# Patient Record
Sex: Female | Born: 1935 | Race: White | Hispanic: No | Smoking: Never smoker
Health system: Southern US, Community
[De-identification: ages and names within clinical notes are randomized; demographics above are authoritative.]

## PROBLEM LIST (undated history)

## (undated) DIAGNOSIS — F0280 Dementia in other diseases classified elsewhere without behavioral disturbance: Secondary | ICD-10-CM

## (undated) DIAGNOSIS — G309 Alzheimer's disease, unspecified: Secondary | ICD-10-CM

## (undated) DIAGNOSIS — I10 Essential (primary) hypertension: Secondary | ICD-10-CM

## (undated) DIAGNOSIS — F419 Anxiety disorder, unspecified: Secondary | ICD-10-CM

## (undated) DIAGNOSIS — D509 Iron deficiency anemia, unspecified: Secondary | ICD-10-CM

## (undated) HISTORY — PX: OTHER SURGICAL HISTORY: SHX169

---

## 2004-07-01 ENCOUNTER — Other Ambulatory Visit: Payer: Self-pay

## 2004-07-01 ENCOUNTER — Inpatient Hospital Stay: Payer: Self-pay | Admitting: Internal Medicine

## 2004-07-02 ENCOUNTER — Other Ambulatory Visit: Payer: Self-pay

## 2005-10-27 ENCOUNTER — Ambulatory Visit: Payer: Self-pay | Admitting: General Practice

## 2005-10-29 ENCOUNTER — Inpatient Hospital Stay: Payer: Self-pay | Admitting: Internal Medicine

## 2005-10-29 ENCOUNTER — Other Ambulatory Visit: Payer: Self-pay

## 2006-06-28 ENCOUNTER — Ambulatory Visit: Payer: Self-pay | Admitting: Internal Medicine

## 2007-05-29 ENCOUNTER — Ambulatory Visit: Payer: Self-pay | Admitting: Internal Medicine

## 2007-08-26 ENCOUNTER — Ambulatory Visit: Payer: Self-pay | Admitting: Internal Medicine

## 2007-12-30 ENCOUNTER — Emergency Department: Payer: Self-pay | Admitting: Emergency Medicine

## 2007-12-30 ENCOUNTER — Other Ambulatory Visit: Payer: Self-pay

## 2008-08-15 ENCOUNTER — Emergency Department: Payer: Self-pay

## 2008-11-25 ENCOUNTER — Emergency Department: Payer: Self-pay | Admitting: Internal Medicine

## 2009-02-14 ENCOUNTER — Inpatient Hospital Stay: Payer: Self-pay | Admitting: Internal Medicine

## 2009-07-12 ENCOUNTER — Emergency Department: Payer: Self-pay | Admitting: Emergency Medicine

## 2009-07-14 ENCOUNTER — Emergency Department: Payer: Self-pay | Admitting: Emergency Medicine

## 2009-07-16 ENCOUNTER — Emergency Department: Payer: Self-pay | Admitting: Emergency Medicine

## 2009-11-10 ENCOUNTER — Emergency Department: Payer: Self-pay | Admitting: Emergency Medicine

## 2010-04-27 ENCOUNTER — Emergency Department: Payer: Self-pay | Admitting: Emergency Medicine

## 2010-10-11 ENCOUNTER — Inpatient Hospital Stay: Payer: Self-pay | Admitting: Family Medicine

## 2010-10-21 DIAGNOSIS — G309 Alzheimer's disease, unspecified: Secondary | ICD-10-CM

## 2010-10-21 DIAGNOSIS — F0280 Dementia in other diseases classified elsewhere without behavioral disturbance: Secondary | ICD-10-CM

## 2010-10-21 DIAGNOSIS — D649 Anemia, unspecified: Secondary | ICD-10-CM

## 2010-10-21 DIAGNOSIS — R55 Syncope and collapse: Secondary | ICD-10-CM

## 2010-10-21 DIAGNOSIS — F411 Generalized anxiety disorder: Secondary | ICD-10-CM

## 2010-12-23 DIAGNOSIS — G309 Alzheimer's disease, unspecified: Secondary | ICD-10-CM

## 2010-12-23 DIAGNOSIS — R55 Syncope and collapse: Secondary | ICD-10-CM

## 2010-12-23 DIAGNOSIS — D509 Iron deficiency anemia, unspecified: Secondary | ICD-10-CM

## 2010-12-23 DIAGNOSIS — I10 Essential (primary) hypertension: Secondary | ICD-10-CM

## 2010-12-23 DIAGNOSIS — F411 Generalized anxiety disorder: Secondary | ICD-10-CM

## 2010-12-23 DIAGNOSIS — F0280 Dementia in other diseases classified elsewhere without behavioral disturbance: Secondary | ICD-10-CM

## 2010-12-27 DIAGNOSIS — F411 Generalized anxiety disorder: Secondary | ICD-10-CM

## 2011-01-20 DIAGNOSIS — G309 Alzheimer's disease, unspecified: Secondary | ICD-10-CM

## 2011-01-20 DIAGNOSIS — F411 Generalized anxiety disorder: Secondary | ICD-10-CM

## 2011-01-20 DIAGNOSIS — F0280 Dementia in other diseases classified elsewhere without behavioral disturbance: Secondary | ICD-10-CM

## 2011-01-20 DIAGNOSIS — I10 Essential (primary) hypertension: Secondary | ICD-10-CM

## 2011-01-23 ENCOUNTER — Emergency Department: Payer: Self-pay | Admitting: Emergency Medicine

## 2011-01-24 ENCOUNTER — Emergency Department: Payer: Self-pay | Admitting: Emergency Medicine

## 2011-03-07 ENCOUNTER — Ambulatory Visit: Payer: Self-pay | Admitting: Internal Medicine

## 2011-03-15 DIAGNOSIS — F0280 Dementia in other diseases classified elsewhere without behavioral disturbance: Secondary | ICD-10-CM

## 2011-03-15 DIAGNOSIS — R609 Edema, unspecified: Secondary | ICD-10-CM

## 2011-03-15 DIAGNOSIS — F411 Generalized anxiety disorder: Secondary | ICD-10-CM

## 2011-05-12 DIAGNOSIS — K219 Gastro-esophageal reflux disease without esophagitis: Secondary | ICD-10-CM

## 2011-05-12 DIAGNOSIS — G309 Alzheimer's disease, unspecified: Secondary | ICD-10-CM

## 2011-05-12 DIAGNOSIS — F0280 Dementia in other diseases classified elsewhere without behavioral disturbance: Secondary | ICD-10-CM

## 2011-05-12 DIAGNOSIS — F411 Generalized anxiety disorder: Secondary | ICD-10-CM

## 2011-05-12 DIAGNOSIS — I10 Essential (primary) hypertension: Secondary | ICD-10-CM

## 2011-05-16 DIAGNOSIS — M1990 Unspecified osteoarthritis, unspecified site: Secondary | ICD-10-CM

## 2011-06-26 DIAGNOSIS — J309 Allergic rhinitis, unspecified: Secondary | ICD-10-CM

## 2011-07-07 DIAGNOSIS — I10 Essential (primary) hypertension: Secondary | ICD-10-CM

## 2011-07-07 DIAGNOSIS — F0280 Dementia in other diseases classified elsewhere without behavioral disturbance: Secondary | ICD-10-CM

## 2011-07-07 DIAGNOSIS — F411 Generalized anxiety disorder: Secondary | ICD-10-CM

## 2011-07-07 DIAGNOSIS — G309 Alzheimer's disease, unspecified: Secondary | ICD-10-CM

## 2011-09-26 DIAGNOSIS — F411 Generalized anxiety disorder: Secondary | ICD-10-CM

## 2011-09-26 DIAGNOSIS — G309 Alzheimer's disease, unspecified: Secondary | ICD-10-CM

## 2011-09-26 DIAGNOSIS — F0280 Dementia in other diseases classified elsewhere without behavioral disturbance: Secondary | ICD-10-CM

## 2011-09-26 DIAGNOSIS — I10 Essential (primary) hypertension: Secondary | ICD-10-CM

## 2011-09-26 DIAGNOSIS — D649 Anemia, unspecified: Secondary | ICD-10-CM

## 2011-10-13 ENCOUNTER — Telehealth: Payer: Self-pay | Admitting: Family Medicine

## 2011-10-13 NOTE — Telephone Encounter (Signed)
Error

## 2011-11-17 DIAGNOSIS — G309 Alzheimer's disease, unspecified: Secondary | ICD-10-CM

## 2011-11-17 DIAGNOSIS — F0280 Dementia in other diseases classified elsewhere without behavioral disturbance: Secondary | ICD-10-CM

## 2011-11-17 DIAGNOSIS — I10 Essential (primary) hypertension: Secondary | ICD-10-CM

## 2011-11-17 DIAGNOSIS — J45909 Unspecified asthma, uncomplicated: Secondary | ICD-10-CM

## 2011-12-07 DIAGNOSIS — R350 Frequency of micturition: Secondary | ICD-10-CM

## 2012-01-05 DIAGNOSIS — F0280 Dementia in other diseases classified elsewhere without behavioral disturbance: Secondary | ICD-10-CM

## 2012-01-05 DIAGNOSIS — G309 Alzheimer's disease, unspecified: Secondary | ICD-10-CM

## 2012-01-05 DIAGNOSIS — I10 Essential (primary) hypertension: Secondary | ICD-10-CM

## 2012-01-05 DIAGNOSIS — F411 Generalized anxiety disorder: Secondary | ICD-10-CM

## 2012-01-30 DIAGNOSIS — R109 Unspecified abdominal pain: Secondary | ICD-10-CM

## 2012-02-05 ENCOUNTER — Telehealth: Payer: Self-pay | Admitting: Internal Medicine

## 2012-02-05 NOTE — Telephone Encounter (Signed)
Urinalysis completely normal so Rx not indicated

## 2012-02-05 NOTE — Telephone Encounter (Signed)
Triage Record Num: [id number] Operator: Alphonsa Overall Patient Name: Marilea Gwynne Call Date & Time: 02/03/2012 4:38:42PM Patient Phone: PCP: [name] Patient Gender: Female PCP Fax : [phone] Patient DOB: Mar 07, 1933 Practice Name[facility] Reason for Call: Caller: Sharon/RN; PCP: [name]; CB#: [phone]; Call regarding Lab results [facility] calling about Urine cx done for confusion. >100,000 ecoli. Sensitive to Imepinum,Nitrofurantoin, Septra. No symptoms. Afebrile. Vitals WNL. Not on abx. Pt with hx of UTI, clean catch urine. Paged Dr Tawanna Cooler for possible orders. Septra DS 1 po BID x 10 days, Jasmine December aware. Protocol(s) Used: Office Note Recommended Outcome per Protocol: Information Noted and Sent to Office Reason for Outcome: Caller information to office Care Advice: ~ 02/03/2012 5:51:13PM Page 1 of 1 CAN_TriageRpt_V2

## 2012-02-05 NOTE — Telephone Encounter (Signed)
Triage Record Num: [id number] Operator: Maryfrances Bunnell Patient Name: Heather Kirk Call Date & Time: 02/02/2012 6:15:47PM Patient Phone: PCP: Jethro Bolus Name: Nellie Dameron Relationship to Patient: Unknown Patient Gender: Female PCP Fax : Patient DOB: June 17, 1933 Practice Name: [facility] Reason for Call: Rhea Belton RN; Shona Simpson; PCP Talbot Grumbling; Call re: Labs; Preliminary report: Urine culture: gram neg rods > 100,000 col/ml. Final report or Sensitivity not back. No noted symptoms in patient today, afebrile. Advised to monitor symptoms and call MD with final report as soon as received. Protocol(s) Used: Office Note Recommended Outcome per Protocol: Information Noted and Sent to Office Reason for Outcome: Caller information to office Care Advice: ~ 02/02/2012 6:53:18PM Page 1 of 1 CAN_TriageRpt_V2

## 2012-03-25 DIAGNOSIS — G309 Alzheimer's disease, unspecified: Secondary | ICD-10-CM

## 2012-03-25 DIAGNOSIS — F411 Generalized anxiety disorder: Secondary | ICD-10-CM

## 2012-03-25 DIAGNOSIS — F0280 Dementia in other diseases classified elsewhere without behavioral disturbance: Secondary | ICD-10-CM

## 2012-04-16 DIAGNOSIS — R42 Dizziness and giddiness: Secondary | ICD-10-CM

## 2012-04-16 DIAGNOSIS — R209 Unspecified disturbances of skin sensation: Secondary | ICD-10-CM

## 2012-04-18 DIAGNOSIS — I6789 Other cerebrovascular disease: Secondary | ICD-10-CM

## 2012-04-19 ENCOUNTER — Ambulatory Visit: Payer: Self-pay | Admitting: Internal Medicine

## 2012-04-20 ENCOUNTER — Emergency Department: Payer: Self-pay | Admitting: Unknown Physician Specialty

## 2012-04-22 ENCOUNTER — Telehealth: Payer: Self-pay | Admitting: Internal Medicine

## 2012-04-22 ENCOUNTER — Encounter: Payer: Self-pay | Admitting: Internal Medicine

## 2012-04-22 NOTE — Telephone Encounter (Signed)
[facility] Triage Call Report Triage Record Num: [id number] Operator: Jacquenette Shone Patient Name: Heather Kirk Call Date & Time: 04/20/2012 4:16:49PM Patient Phone: PCP: [name] Patient Gender: Female PCP Fax : [phone] Patient DOB: 1933-04-18 Practice Name[facility] Reason for Call: Caller: Cathy/LPN; PCP: [name] (Family Practice); CB#: [phone]; Triage nurse returned call to [facility] and with Jasmine December, RN Lynden Ang, LPN is now gone for the day.) Reports left wrist swollen and painful. Skin red and hot to touch. Patient Onset: 09/21. Treatment:Ultracet given approximnately 2:15pm and patient requesting more pain med. noiw. Jasmine December, RN reports patient refuses to let anyone touch wrist due to pain. States she is "acting confused and she is not normally confused". Patient had fall on 04/17/12. Details of fall are not available. All emergent symptoms ruled out per Wrist Non-Injury with exception " unbearable pain". Advised to have patient evaluated in ED now. Jasmine December, RN verbalized understanding and agreement. Protocol(s) Used: Wrist Non-Injury Recommended Outcome per Protocol: See ED Immediately Reason for Outcome: Unbearable pain Care Advice: ~ IMMEDIATE ACTION Write down provider's name. List or place the following in a bag for transport with the patient: current prescription and/or nonprescription medications; alternative treatments, therapies and medications; and street drugs. ~ ~ Support part in position of comfort to reduce pain and swelling; avoid unnecessary movement. 04/20/2012 4:33:09PM Page 1 of 1 CAN_TriageRpt_V2

## 2012-04-22 NOTE — Telephone Encounter (Signed)
Evaluated and sent back

## 2012-05-01 DIAGNOSIS — F411 Generalized anxiety disorder: Secondary | ICD-10-CM

## 2012-05-01 DIAGNOSIS — E876 Hypokalemia: Secondary | ICD-10-CM

## 2012-05-01 DIAGNOSIS — I10 Essential (primary) hypertension: Secondary | ICD-10-CM

## 2012-05-01 DIAGNOSIS — G309 Alzheimer's disease, unspecified: Secondary | ICD-10-CM

## 2012-05-01 DIAGNOSIS — F0280 Dementia in other diseases classified elsewhere without behavioral disturbance: Secondary | ICD-10-CM

## 2012-05-16 DIAGNOSIS — M25539 Pain in unspecified wrist: Secondary | ICD-10-CM

## 2012-07-12 DIAGNOSIS — I10 Essential (primary) hypertension: Secondary | ICD-10-CM

## 2012-07-12 DIAGNOSIS — F411 Generalized anxiety disorder: Secondary | ICD-10-CM

## 2012-07-12 DIAGNOSIS — F0280 Dementia in other diseases classified elsewhere without behavioral disturbance: Secondary | ICD-10-CM

## 2012-07-12 DIAGNOSIS — G309 Alzheimer's disease, unspecified: Secondary | ICD-10-CM

## 2012-09-20 DIAGNOSIS — I10 Essential (primary) hypertension: Secondary | ICD-10-CM

## 2012-09-20 DIAGNOSIS — F411 Generalized anxiety disorder: Secondary | ICD-10-CM

## 2012-09-20 DIAGNOSIS — T7840XA Allergy, unspecified, initial encounter: Secondary | ICD-10-CM

## 2012-09-20 DIAGNOSIS — J45909 Unspecified asthma, uncomplicated: Secondary | ICD-10-CM

## 2012-09-20 DIAGNOSIS — G309 Alzheimer's disease, unspecified: Secondary | ICD-10-CM

## 2012-09-20 DIAGNOSIS — F0280 Dementia in other diseases classified elsewhere without behavioral disturbance: Secondary | ICD-10-CM

## 2012-10-30 DIAGNOSIS — L988 Other specified disorders of the skin and subcutaneous tissue: Secondary | ICD-10-CM

## 2012-11-18 DIAGNOSIS — F411 Generalized anxiety disorder: Secondary | ICD-10-CM

## 2012-11-18 DIAGNOSIS — F0280 Dementia in other diseases classified elsewhere without behavioral disturbance: Secondary | ICD-10-CM

## 2012-11-18 DIAGNOSIS — G309 Alzheimer's disease, unspecified: Secondary | ICD-10-CM

## 2012-11-18 DIAGNOSIS — I10 Essential (primary) hypertension: Secondary | ICD-10-CM

## 2012-11-18 DIAGNOSIS — M62838 Other muscle spasm: Secondary | ICD-10-CM

## 2013-01-07 DIAGNOSIS — J019 Acute sinusitis, unspecified: Secondary | ICD-10-CM

## 2013-01-17 DIAGNOSIS — M7989 Other specified soft tissue disorders: Secondary | ICD-10-CM

## 2013-03-25 DIAGNOSIS — M159 Polyosteoarthritis, unspecified: Secondary | ICD-10-CM

## 2013-03-25 DIAGNOSIS — F0390 Unspecified dementia without behavioral disturbance: Secondary | ICD-10-CM

## 2013-03-25 DIAGNOSIS — I10 Essential (primary) hypertension: Secondary | ICD-10-CM

## 2013-03-25 DIAGNOSIS — F411 Generalized anxiety disorder: Secondary | ICD-10-CM

## 2013-03-25 DIAGNOSIS — F22 Delusional disorders: Secondary | ICD-10-CM

## 2013-04-17 DIAGNOSIS — M109 Gout, unspecified: Secondary | ICD-10-CM

## 2013-04-22 DIAGNOSIS — J029 Acute pharyngitis, unspecified: Secondary | ICD-10-CM

## 2013-05-16 DIAGNOSIS — D509 Iron deficiency anemia, unspecified: Secondary | ICD-10-CM

## 2013-05-16 DIAGNOSIS — G309 Alzheimer's disease, unspecified: Secondary | ICD-10-CM

## 2013-05-16 DIAGNOSIS — F0280 Dementia in other diseases classified elsewhere without behavioral disturbance: Secondary | ICD-10-CM

## 2013-05-16 DIAGNOSIS — F411 Generalized anxiety disorder: Secondary | ICD-10-CM

## 2013-07-15 DIAGNOSIS — F411 Generalized anxiety disorder: Secondary | ICD-10-CM

## 2013-07-15 DIAGNOSIS — M1710 Unilateral primary osteoarthritis, unspecified knee: Secondary | ICD-10-CM

## 2013-07-15 DIAGNOSIS — F0390 Unspecified dementia without behavioral disturbance: Secondary | ICD-10-CM

## 2013-07-15 DIAGNOSIS — I10 Essential (primary) hypertension: Secondary | ICD-10-CM

## 2013-09-16 DIAGNOSIS — M545 Low back pain, unspecified: Secondary | ICD-10-CM

## 2013-11-26 DIAGNOSIS — I10 Essential (primary) hypertension: Secondary | ICD-10-CM

## 2013-11-26 DIAGNOSIS — IMO0002 Reserved for concepts with insufficient information to code with codable children: Secondary | ICD-10-CM

## 2013-11-26 DIAGNOSIS — M1710 Unilateral primary osteoarthritis, unspecified knee: Secondary | ICD-10-CM

## 2013-11-26 DIAGNOSIS — F0390 Unspecified dementia without behavioral disturbance: Secondary | ICD-10-CM

## 2013-11-26 DIAGNOSIS — F411 Generalized anxiety disorder: Secondary | ICD-10-CM

## 2013-12-19 DIAGNOSIS — J45909 Unspecified asthma, uncomplicated: Secondary | ICD-10-CM

## 2013-12-29 DIAGNOSIS — S40029A Contusion of unspecified upper arm, initial encounter: Secondary | ICD-10-CM

## 2014-02-09 DIAGNOSIS — F23 Brief psychotic disorder: Secondary | ICD-10-CM

## 2014-02-16 DIAGNOSIS — N390 Urinary tract infection, site not specified: Secondary | ICD-10-CM

## 2014-03-07 ENCOUNTER — Inpatient Hospital Stay: Payer: Self-pay | Admitting: Internal Medicine

## 2014-03-07 LAB — URINALYSIS, COMPLETE
BACTERIA: NONE SEEN
Bilirubin,UR: NEGATIVE
Glucose,UR: NEGATIVE mg/dL (ref 0–75)
Ketone: NEGATIVE
LEUKOCYTE ESTERASE: NEGATIVE
Nitrite: NEGATIVE
PROTEIN: NEGATIVE
Ph: 5 (ref 4.5–8.0)
SPECIFIC GRAVITY: 1.013 (ref 1.003–1.030)
Squamous Epithelial: <1

## 2014-03-07 LAB — PROTIME-INR
INR: 1
Prothrombin Time: 12.9 secs (ref 11.5–14.7)

## 2014-03-07 LAB — BASIC METABOLIC PANEL
Anion Gap: 7 (ref 7–16)
BUN: 22 mg/dL — AB (ref 7–18)
CHLORIDE: 109 mmol/L — AB (ref 98–107)
CO2: 24 mmol/L (ref 21–32)
CREATININE: 1.48 mg/dL — AB (ref 0.60–1.30)
Calcium, Total: 9.4 mg/dL (ref 8.5–10.1)
EGFR (Non-African Amer.): 34 — ABNORMAL LOW
GFR CALC AF AMER: 39 — AB
Glucose: 119 mg/dL — ABNORMAL HIGH (ref 65–99)
OSMOLALITY: 284 (ref 275–301)
POTASSIUM: 4.5 mmol/L (ref 3.5–5.1)
Sodium: 140 mmol/L (ref 136–145)

## 2014-03-07 LAB — CBC
HCT: 40.9 % (ref 35.0–47.0)
HGB: 13.7 g/dL (ref 12.0–16.0)
MCH: 28.4 pg (ref 26.0–34.0)
MCHC: 33.5 g/dL (ref 32.0–36.0)
MCV: 85 fL (ref 80–100)
Platelet: 211 10*3/uL (ref 150–440)
RBC: 4.82 10*6/uL (ref 3.80–5.20)
RDW: 13.7 % (ref 11.5–14.5)
WBC: 9.9 10*3/uL (ref 3.6–11.0)

## 2014-03-08 LAB — CBC WITH DIFFERENTIAL/PLATELET
BASOS ABS: 0.1 10*3/uL (ref 0.0–0.1)
Basophil %: 1.2 %
EOS ABS: 0.2 10*3/uL (ref 0.0–0.7)
EOS PCT: 3.5 %
HCT: 38.5 % (ref 35.0–47.0)
HGB: 12.6 g/dL (ref 12.0–16.0)
Lymphocyte #: 1 10*3/uL (ref 1.0–3.6)
Lymphocyte %: 20.2 %
MCH: 28.2 pg (ref 26.0–34.0)
MCHC: 32.7 g/dL (ref 32.0–36.0)
MCV: 86 fL (ref 80–100)
MONO ABS: 0.5 x10 3/mm (ref 0.2–0.9)
MONOS PCT: 10 %
NEUTROS PCT: 65.1 %
Neutrophil #: 3.3 10*3/uL (ref 1.4–6.5)
Platelet: 179 10*3/uL (ref 150–440)
RBC: 4.48 10*6/uL (ref 3.80–5.20)
RDW: 14.1 % (ref 11.5–14.5)
WBC: 5 10*3/uL (ref 3.6–11.0)

## 2014-03-08 LAB — BASIC METABOLIC PANEL
Anion Gap: 7 (ref 7–16)
BUN: 24 mg/dL — AB (ref 7–18)
CALCIUM: 8.2 mg/dL — AB (ref 8.5–10.1)
CHLORIDE: 112 mmol/L — AB (ref 98–107)
CO2: 25 mmol/L (ref 21–32)
Creatinine: 1.76 mg/dL — ABNORMAL HIGH (ref 0.60–1.30)
GFR CALC AF AMER: 32 — AB
GFR CALC NON AF AMER: 27 — AB
Glucose: 90 mg/dL (ref 65–99)
OSMOLALITY: 290 (ref 275–301)
POTASSIUM: 5.4 mmol/L — AB (ref 3.5–5.1)
Sodium: 144 mmol/L (ref 136–145)

## 2014-03-08 LAB — POTASSIUM: Potassium: 4.1 mmol/L (ref 3.5–5.1)

## 2014-03-08 LAB — MAGNESIUM: Magnesium: 1.7 mg/dL — ABNORMAL LOW

## 2014-03-09 LAB — CBC WITH DIFFERENTIAL/PLATELET
BASOS ABS: 0.1 10*3/uL (ref 0.0–0.1)
BASOS PCT: 0.9 %
EOS PCT: 5.4 %
Eosinophil #: 0.4 10*3/uL (ref 0.0–0.7)
HCT: 34.7 % — ABNORMAL LOW (ref 35.0–47.0)
HGB: 11.5 g/dL — ABNORMAL LOW (ref 12.0–16.0)
LYMPHS PCT: 16.6 %
Lymphocyte #: 1.1 10*3/uL (ref 1.0–3.6)
MCH: 28.3 pg (ref 26.0–34.0)
MCHC: 33.1 g/dL (ref 32.0–36.0)
MCV: 86 fL (ref 80–100)
MONO ABS: 0.7 x10 3/mm (ref 0.2–0.9)
MONOS PCT: 10.8 %
NEUTROS PCT: 66.3 %
Neutrophil #: 4.4 10*3/uL (ref 1.4–6.5)
Platelet: 149 10*3/uL — ABNORMAL LOW (ref 150–440)
RBC: 4.05 10*6/uL (ref 3.80–5.20)
RDW: 14.3 % (ref 11.5–14.5)
WBC: 6.6 10*3/uL (ref 3.6–11.0)

## 2014-03-09 LAB — URINE CULTURE

## 2014-03-09 LAB — BASIC METABOLIC PANEL
ANION GAP: 9 (ref 7–16)
BUN: 23 mg/dL — ABNORMAL HIGH (ref 7–18)
CALCIUM: 8 mg/dL — AB (ref 8.5–10.1)
CO2: 23 mmol/L (ref 21–32)
CREATININE: 1.77 mg/dL — AB (ref 0.60–1.30)
Chloride: 108 mmol/L — ABNORMAL HIGH (ref 98–107)
GFR CALC AF AMER: 31 — AB
GFR CALC NON AF AMER: 27 — AB
GLUCOSE: 85 mg/dL (ref 65–99)
Osmolality: 282 (ref 275–301)
POTASSIUM: 4.1 mmol/L (ref 3.5–5.1)
Sodium: 140 mmol/L (ref 136–145)

## 2014-03-10 LAB — HEMOGLOBIN: HGB: 12.1 g/dL (ref 12.0–16.0)

## 2014-03-11 DIAGNOSIS — S7290XA Unspecified fracture of unspecified femur, initial encounter for closed fracture: Secondary | ICD-10-CM

## 2014-03-24 DIAGNOSIS — N390 Urinary tract infection, site not specified: Secondary | ICD-10-CM

## 2014-05-27 DIAGNOSIS — F419 Anxiety disorder, unspecified: Secondary | ICD-10-CM

## 2014-05-27 DIAGNOSIS — R451 Restlessness and agitation: Secondary | ICD-10-CM

## 2014-05-27 DIAGNOSIS — F0390 Unspecified dementia without behavioral disturbance: Secondary | ICD-10-CM

## 2014-05-27 DIAGNOSIS — I10 Essential (primary) hypertension: Secondary | ICD-10-CM

## 2014-05-27 DIAGNOSIS — I301 Infective pericarditis: Secondary | ICD-10-CM

## 2014-05-27 DIAGNOSIS — M1990 Unspecified osteoarthritis, unspecified site: Secondary | ICD-10-CM

## 2014-07-28 DIAGNOSIS — F0390 Unspecified dementia without behavioral disturbance: Secondary | ICD-10-CM

## 2014-07-28 DIAGNOSIS — F333 Major depressive disorder, recurrent, severe with psychotic symptoms: Secondary | ICD-10-CM

## 2014-07-28 DIAGNOSIS — M150 Primary generalized (osteo)arthritis: Secondary | ICD-10-CM

## 2014-07-28 DIAGNOSIS — I10 Essential (primary) hypertension: Secondary | ICD-10-CM

## 2014-07-28 DIAGNOSIS — K219 Gastro-esophageal reflux disease without esophagitis: Secondary | ICD-10-CM

## 2014-08-10 DIAGNOSIS — J209 Acute bronchitis, unspecified: Secondary | ICD-10-CM

## 2014-08-17 DIAGNOSIS — R509 Fever, unspecified: Secondary | ICD-10-CM

## 2014-09-18 DIAGNOSIS — M810 Age-related osteoporosis without current pathological fracture: Secondary | ICD-10-CM | POA: Diagnosis not present

## 2014-09-18 DIAGNOSIS — I10 Essential (primary) hypertension: Secondary | ICD-10-CM

## 2014-09-18 DIAGNOSIS — F0150 Vascular dementia without behavioral disturbance: Secondary | ICD-10-CM

## 2014-09-18 DIAGNOSIS — K219 Gastro-esophageal reflux disease without esophagitis: Secondary | ICD-10-CM

## 2014-09-18 DIAGNOSIS — M1990 Unspecified osteoarthritis, unspecified site: Secondary | ICD-10-CM | POA: Diagnosis not present

## 2014-09-18 DIAGNOSIS — F323 Major depressive disorder, single episode, severe with psychotic features: Secondary | ICD-10-CM

## 2014-09-18 DIAGNOSIS — F29 Unspecified psychosis not due to a substance or known physiological condition: Secondary | ICD-10-CM

## 2014-11-19 DIAGNOSIS — J209 Acute bronchitis, unspecified: Secondary | ICD-10-CM

## 2014-11-21 NOTE — Op Note (Signed)
PATIENT NAME:  Heather Kirk, Heather Kirk MR#:  [id number] DATE OF BIRTH:  09-30-1932  DATE OF PROCEDURE:  03/08/2014  PREOPERATIVE DIAGNOSES: Nondisplaced left femoral neck fracture.    PREOPERATIVE DIAGNOSIS: Nondisplaced left femoral neck fracture.   OPERATION: Percutaneous pinning, left femoral neck fracture.   SURGEON: Valinda HoarHoward E. Makih Stefanko.   ANESTHESIA: Spinal.   COMPLICATIONS: None.   DRAINS: None.   ESTIMATED BLOOD LOSS: Minimal.   BLOOD REPLACED:  None.   DESCRIPTION OF PROCEDURE: The patient was brought to the operating room where she underwent satisfactory spinal anesthesia and was placed in the supine position on the fracture table. She was positioned appropriately and the right leg was flexed and abducted. The left leg was internally rotated and placed in minimal traction. Fluoroscopy showed the femoral neck fracture remained in good position on AP and lateral views.   The hip was prepped and draped in sterile fashion and 4 stab wounds were made. The guide pins were inserted and placed in the head and neck in satisfactory position. Four 7.3-mm cannulated cancellous screws were then inserted to appropriate depth. The guide pins were removed. Fluoroscopy showed good position of the all hardware. The stab wounds were closed with 3-0 nylon suture and a dry sterile dressing was applied. The patient was taken out of traction and transferred to her hospital bed in good condition. She was taken to recovery.    ____________________________ Valinda HoarHoward E. Dayane Hillenburg, MD hem:lt D: 03/08/2014 11:37:15 ET T: 03/08/2014 11:59:20 ET JOB#: [id number]  cc: Valinda HoarHoward E. Dahiana Kulak, MD, <Dictator> Valinda HoarHOWARD E Cleven Jansma MD ELECTRONICALLY SIGNED 03/08/2014 21:18

## 2014-11-21 NOTE — H&P (Signed)
PATIENT NAME:  Heather Kirk, Heather Kirk MR#:  [id number] DATE OF BIRTH:  1933/04/18  DATE OF ADMISSION:  03/07/2014  PRIMARY CARE PHYSICIAN: Dr. Diona Fanti.    REFERRING PHYSICIAN: Dr. Margarita Grizzle.    CHIEF COMPLAINT: Mechanical fall today.   HISTORY OF PRESENT ILLNESS: A 81 year old Caucasian female with a history of hypertension, Alzheimer dementia, asthma, GERD, was sent from nursing home due to a fall today. The patient is alert, awake x 2. She said that she fell by accident today. She denies any syncope, loss of consciousness, or seizure. However, she felt dizzy and weak before this fall. The patient denies any other symptoms. She also complains of dysuria, urine frequency, but denies incontinence.   PAST MEDICAL HISTORY: Hypertension, Alzheimer dementia, asthma, GERD, hyperlipidemia.   SOCIAL HISTORY:  No smoking or drinking or illicit drugs.   FAMILY HISTORY: Hypertension.   PAST SURGICAL HISTORY:  Abdominal surgery. The patient does not know the exact name.   ALLERGIES: None.   HOME MEDICATIONS: Vitamin D 50,000 international units one cap once a day, tramadol 50 mg p.o. b.i.d., p.r.n., Seroquel  25 mg p.o. b.i.d., Q-PAP 325 mg 2 tablets 4 times a day p.r.n., potassium chloride 10 mEq 1 tablet b.i.d., milk of magnesia 30 mL b.i.d. p.r.n. for constipation, ipratropium 21 mcg 1 spray in each nostril twice a day, Flonase 50 mcg nasal one spray once a day, ferrous sulfate 325 mg p.o. daily, donepezil 10 mg p.o. once a day at bedtime, Cetirizine 10 mg p.o. once a day p.r.n., Atrovent nasal 1 spray twice a day, aspirin 81 mg p.o. daily, Norvasc 10 mg p.o. daily, alprazolam 0.25 mg p.o. t.i.d.    REVIEW OF SYSTEMS:  CONSTITUTIONAL: The patient denies any fever or chills. No headache but has dizziness and weakness.  EYES: No double vision or blurred vision.  ENT: No postnasal drip, slurred speech, or dysphagia.  CARDIOVASCULAR: No chest pain, palpitation, orthopnea, nocturnal dyspnea. No leg  edema.  PULMONARY: No cough, sputum, shortness of breath, or hemoptysis.  GASTROINTESTINAL: No abdominal pain, nausea, vomiting, or diarrhea. No melena or bloody stool.  GENITOURINARY: Dysuria and urinary frequency. No hematuria or incontinence.  SKIN: No rash or jaundice.  NEUROLOGY: No syncope, loss of consciousness, or seizure.  HEMATOLOGY: No easy bruising  or bleeding.  ENDOCRINOLOGY: No polyuria, polydipsia, heat or cold intolerance.   PHYSICAL EXAMINATION: VITAL SIGNS: Temperature 98.8, blood pressure 129/85, pulse of 74, oxygen saturation 96% on room air.  GENERAL: The patient is alert, awake, oriented, in x 2. Follows commands.  HEENT: Pupils round, equal, and reactive to light and accommodation. Mild dry oral mucosa. Clear oropharynx.  NECK: Supple. No JVD or carotid bruits. No lymphadenopathy. No thyromegaly.  CARDIOVASCULAR: S1, S2, regular rate and rhythm. No murmurs or gallops.  PULMONARY: Bilateral air entry. No wheezing or rales. No use of accessory muscles to breathe.  ABDOMEN: Soft. No distention or tenderness. No organomegaly. Bowel sounds present.  SKIN: No rash or jaundice.  EXTREMITIES: The patient cannot move the left lower extremity. The right lower extremity power 3/5, 5/5 on bilateral upper extremities, and no focal deficit.   LABORATORY DATA: Left hip x-ray showed transverse subcapital left hip fracture, slight impaction, osteopenia. Chest x-ray, an 11 cm rounded soft tissue structure underlying the right heart, suspect hiatal hernia. Recommended a lateral chest x-ray for further evaluation. CBC in normal range. Glucose 119, BUN 22, creatinine 1.48, sodium 140, potassium 4.5, chloride 109, bicarbonate 24. INR 1.0. Urinalysis negative. EKG showed  normal sinus rhythm at a 73 BPM, possible left atrial enlargement, right axis deviation.   IMPRESSIONS:  1.  Left hip fracture.  2.  Acute renal failure with dehydration.  3.  Hypertension.  4.  Alzheimer dementia.  5.   History of asthma.   PLAN OF TREATMENT: 1.  The patient will be admitted to medical floor with telemonitor. We will give IV fluid support. Follow up BMP. Follow up orthopedic surgeon for surgery. The patient has moderate risk for surgery.  2.  We will hold aspirin and start Lopressor, hold Norvasc.   I discussed the patient's condition and plan of treatment with the patient's daughter. The patient has a yellow paper for DNR.   TIME SPENT: About 52 minutes.    ____________________________ Shaune PollackQing Tamaj Jurgens, MD qc:at D: 03/07/2014 20:12:13 ET T: 03/07/2014 20:27:35 ET JOB#: [id number]  cc: Shaune PollackQing Nadirah Socorro, MD, <Dictator> Shaune PollackQING Toinette Lackie MD ELECTRONICALLY SIGNED 03/09/2014 13:27

## 2014-11-21 NOTE — Discharge Summary (Signed)
PATIENT NAME:  Jarome MatinWILLIAMSON, Heather C MR#:  [id number] DATE OF BIRTH:  July 29, 1933  DATE OF ADMISSION:  03/07/2014 DATE OF DISCHARGE:  03/10/2014  PRESENTING COMPLAINT: Fall with hip pain.   DISCHARGE DIAGNOSES: 1.  Nondisplaced left femoral neck fracture status post percutaneous pinning by Dr. Deeann SaintHoward Miller under spinal anesthesia on 03/08/2014.  2.  History of dementia.  3.  Hypertension.  4.  History of acute renal failure with dehydration, improved.  5.  History of asthma, stable.   CODE STATUS: No code, DNR.   DISCHARGE DIET: Regular.   DISCHARGE INSTRUCTIONS: Follow up with Dr. Deeann SaintHoward Miller in 2 weeks.   MEDICATIONS AT DISCHARGE: 1.  Ergocalciferol 50,000 units p.o. monthly, 24th of each month.  2.  Amlodipine 10 mg daily.  3.  Dulcolax 10 mg rectal at bedtime as needed.  4.  Docusate 240 p.o. at bedtime.  5.  Aspirin 325 mg b.i.d. for DVT prophylaxis, per Dr. Deeann SaintHoward Miller, for 3 week and then changed to aspirin 81 mg daily.  6.  Norco 5/325 one tablet q. 4 p.r.n.  7.  Aricept 10 mg at bedtime.  8.  Ferrous sulfate 325 mg p.o. daily.  9.  Ipratropium bromide 0.06% nasal spray to both nostrils b.i.d.  10.  Flonase 2 sprays to both nostrils daily.  11.  Tylenol 650 q. 4 p.r.n.  12.  Docusate 100 mg b.i.d. p.r.n.  13.  Antivert 12.5 mg t.i.d.  14.  Quetiapine 25 mg b.i.d.  15.  Fosamax 70 mg q. weekly.  16.  Calcium carbonate with vitamin D 1 tablet b.i.d.  17.  Citalopram 20 mg daily.  18.  Colchicine 0.6 mg b.i.d.  19.  Zyrtec 10 mg at bedtime.  20.  Alprazolam 0.25 mg q. 8 hourly.  21.  Protonix 40 mg daily.   DIAGNOSTIC DATA: Labs at discharge: Creatinine is 1.77. Potassium is 4.1. Sodium is 140. Hemoglobin is 12.1. White count is 6.6. Platelet count is 149.   BRIEF SUMMARY OF HOSPITAL COURSE: Heather Kirk is a pleasant 81 year old Caucasian female who came in with a mechanical fall. She was admitted with:  1.  Acute left nondisplaced femoral neck fracture. The  patient has chronic vertigo and has had falls in the past. She is postop day 2.  She underwent percutaneous left hip pinning by Dr. Deeann SaintHoward Miller on August 9. She is progressing well with physical therapy who recommends rehab. She will [facility] back [facility]. She is on p.o. calcium, bisphosphonates and vitamin D.  2.  Acute renal failure with dehydration and hypokalemia, improved. Potassium is 4.1 at discharge. The patient will not be receiving any p.o. oral potassium.  3.  Hypertension. Continue amlodipine. 4.  Alzheimer dementia, on Donepezil.   5.  History of asthma, stable. Continue p.r.n. oxygen, p.r.n. nebs and oral inhalers.  6.  DVT prophylaxis per Dr. Hyacinth MeekerMiller is with p.o. aspirin 325 mg b.i.d.   Hospital stay otherwise remained stable. The patient remained a patient remained a no code, DNR.   TIME SPENT: 40 minutes.  ____________________________ Wylie HailSona A. Allena KatzPatel, MD sap:sb D: 03/10/2014 13:07:06 ET T: 03/10/2014 13:37:45 ET JOB#: [id number]  cc: Berlene Dixson A. Allena KatzPatel, MD, <Dictator> Willow OraSONA A Cathy Ropp MD ELECTRONICALLY SIGNED 03/11/2014 13:53

## 2014-11-21 NOTE — Consult Note (Signed)
Brief Consult Note: Diagnosis: Left femoral neck fracture.   Patient was seen by consultant.   Consult note dictated.   Recommend to proceed with surgery or procedure.   Recommend further assessment or treatment.   Orders entered.   Discussed with Attending MD.   Comments: 81 year old female with dementia resident of [facility] skilled nursing facility fell this AM injuring the left leg.  Her daughter says she has had multiple falls due to vertigo.  Brought to Emergency Room where exam and X-rays show a hairline left femoral neck fracture. Discussed treatment with daughter who wishes to proceed with percutaneous pinning of the hip. Cleared by Dr Imogene Burnhen for surgery. Will do this in AM.  Risks and benefits of surgery were discussed at length including but not limited to infection, non union, nerve or blood vessed damage, non union, need for repeat surgery, blood clots and lung emboli, and death.   Exam: Awake, confused.  circulation/sensation/motor function good left leg. Bruise left knee.  Pain with range of motion left leg.  Skin intact.  No other injuries noted.,   X-rays: as above  Rx: Percutaneous pinning left hip tomorrow,.  Electronic Signatures: Valinda HoarMiller, Charl Wellen E (MD)  (Signed 08-Aug-15 21:15)  Authored: Brief Consult Note   Last Updated: 08-Aug-15 21:15 by Valinda HoarMiller, Rama Sorci E (MD)

## 2015-01-08 DIAGNOSIS — F22 Delusional disorders: Secondary | ICD-10-CM | POA: Diagnosis not present

## 2015-01-08 DIAGNOSIS — F0150 Vascular dementia without behavioral disturbance: Secondary | ICD-10-CM

## 2015-01-08 DIAGNOSIS — M1990 Unspecified osteoarthritis, unspecified site: Secondary | ICD-10-CM

## 2015-01-08 DIAGNOSIS — K219 Gastro-esophageal reflux disease without esophagitis: Secondary | ICD-10-CM

## 2015-01-08 DIAGNOSIS — F323 Major depressive disorder, single episode, severe with psychotic features: Secondary | ICD-10-CM

## 2015-01-08 DIAGNOSIS — I10 Essential (primary) hypertension: Secondary | ICD-10-CM | POA: Diagnosis not present

## 2015-01-08 DIAGNOSIS — M180 Bilateral primary osteoarthritis of first carpometacarpal joints: Secondary | ICD-10-CM

## 2015-03-11 DIAGNOSIS — K219 Gastro-esophageal reflux disease without esophagitis: Secondary | ICD-10-CM

## 2015-03-11 DIAGNOSIS — M810 Age-related osteoporosis without current pathological fracture: Secondary | ICD-10-CM

## 2015-03-11 DIAGNOSIS — D649 Anemia, unspecified: Secondary | ICD-10-CM | POA: Diagnosis not present

## 2015-03-11 DIAGNOSIS — F0151 Vascular dementia with behavioral disturbance: Secondary | ICD-10-CM

## 2015-03-11 DIAGNOSIS — I10 Essential (primary) hypertension: Secondary | ICD-10-CM

## 2015-03-11 DIAGNOSIS — M159 Polyosteoarthritis, unspecified: Secondary | ICD-10-CM

## 2015-03-11 DIAGNOSIS — F333 Major depressive disorder, recurrent, severe with psychotic symptoms: Secondary | ICD-10-CM

## 2015-05-18 DIAGNOSIS — I10 Essential (primary) hypertension: Secondary | ICD-10-CM

## 2015-05-18 DIAGNOSIS — K219 Gastro-esophageal reflux disease without esophagitis: Secondary | ICD-10-CM

## 2015-05-18 DIAGNOSIS — F323 Major depressive disorder, single episode, severe with psychotic features: Secondary | ICD-10-CM

## 2015-05-18 DIAGNOSIS — M1990 Unspecified osteoarthritis, unspecified site: Secondary | ICD-10-CM | POA: Diagnosis not present

## 2015-05-18 DIAGNOSIS — F0150 Vascular dementia without behavioral disturbance: Secondary | ICD-10-CM

## 2015-05-18 DIAGNOSIS — M810 Age-related osteoporosis without current pathological fracture: Secondary | ICD-10-CM

## 2015-05-18 DIAGNOSIS — D649 Anemia, unspecified: Secondary | ICD-10-CM | POA: Diagnosis not present

## 2015-07-27 DIAGNOSIS — M159 Polyosteoarthritis, unspecified: Secondary | ICD-10-CM

## 2015-07-27 DIAGNOSIS — I10 Essential (primary) hypertension: Secondary | ICD-10-CM | POA: Diagnosis not present

## 2015-07-27 DIAGNOSIS — K219 Gastro-esophageal reflux disease without esophagitis: Secondary | ICD-10-CM

## 2015-07-27 DIAGNOSIS — F333 Major depressive disorder, recurrent, severe with psychotic symptoms: Secondary | ICD-10-CM

## 2015-07-27 DIAGNOSIS — F0151 Vascular dementia with behavioral disturbance: Secondary | ICD-10-CM

## 2015-09-22 DIAGNOSIS — K219 Gastro-esophageal reflux disease without esophagitis: Secondary | ICD-10-CM

## 2015-09-22 DIAGNOSIS — M1990 Unspecified osteoarthritis, unspecified site: Secondary | ICD-10-CM

## 2015-09-22 DIAGNOSIS — I10 Essential (primary) hypertension: Secondary | ICD-10-CM

## 2015-09-22 DIAGNOSIS — F0150 Vascular dementia without behavioral disturbance: Secondary | ICD-10-CM

## 2015-09-22 DIAGNOSIS — F332 Major depressive disorder, recurrent severe without psychotic features: Secondary | ICD-10-CM

## 2015-11-11 DIAGNOSIS — F3341 Major depressive disorder, recurrent, in partial remission: Secondary | ICD-10-CM | POA: Diagnosis not present

## 2015-11-11 DIAGNOSIS — I10 Essential (primary) hypertension: Secondary | ICD-10-CM

## 2015-11-11 DIAGNOSIS — F0151 Vascular dementia with behavioral disturbance: Secondary | ICD-10-CM

## 2015-11-11 DIAGNOSIS — M159 Polyosteoarthritis, unspecified: Secondary | ICD-10-CM

## 2016-01-19 DIAGNOSIS — F39 Unspecified mood [affective] disorder: Secondary | ICD-10-CM | POA: Diagnosis not present

## 2016-01-19 DIAGNOSIS — F0150 Vascular dementia without behavioral disturbance: Secondary | ICD-10-CM

## 2016-01-19 DIAGNOSIS — I10 Essential (primary) hypertension: Secondary | ICD-10-CM | POA: Diagnosis not present

## 2016-01-19 DIAGNOSIS — M1990 Unspecified osteoarthritis, unspecified site: Secondary | ICD-10-CM | POA: Diagnosis not present

## 2016-03-14 DIAGNOSIS — F322 Major depressive disorder, single episode, severe without psychotic features: Secondary | ICD-10-CM | POA: Diagnosis not present

## 2016-03-14 DIAGNOSIS — I10 Essential (primary) hypertension: Secondary | ICD-10-CM

## 2016-03-14 DIAGNOSIS — F0151 Vascular dementia with behavioral disturbance: Secondary | ICD-10-CM | POA: Diagnosis not present

## 2016-03-14 DIAGNOSIS — M159 Polyosteoarthritis, unspecified: Secondary | ICD-10-CM | POA: Diagnosis not present

## 2016-04-22 ENCOUNTER — Emergency Department: Payer: Medicare Other

## 2016-04-22 ENCOUNTER — Emergency Department
Admission: EM | Admit: 2016-04-22 | Discharge: 2016-04-22 | Disposition: A | Discharge status: Home / Self Care | Payer: Medicare Other | Source: Home / Self Care | Attending: Emergency Medicine | Admitting: Emergency Medicine

## 2016-04-22 DIAGNOSIS — K6289 Other specified diseases of anus and rectum: Secondary | ICD-10-CM | POA: Insufficient documentation

## 2016-04-22 DIAGNOSIS — N390 Urinary tract infection, site not specified: Secondary | ICD-10-CM | POA: Insufficient documentation

## 2016-04-22 DIAGNOSIS — G309 Alzheimer's disease, unspecified: Secondary | ICD-10-CM | POA: Insufficient documentation

## 2016-04-22 DIAGNOSIS — I10 Essential (primary) hypertension: Secondary | ICD-10-CM | POA: Insufficient documentation

## 2016-04-22 HISTORY — DX: Alzheimer's disease, unspecified: G30.9

## 2016-04-22 HISTORY — DX: Anxiety disorder, unspecified: F41.9

## 2016-04-22 HISTORY — DX: Iron deficiency anemia, unspecified: D50.9

## 2016-04-22 HISTORY — DX: Essential (primary) hypertension: I10

## 2016-04-22 HISTORY — DX: Dementia in other diseases classified elsewhere, unspecified severity, without behavioral disturbance, psychotic disturbance, mood disturbance, and anxiety: F02.80

## 2016-04-22 LAB — BASIC METABOLIC PANEL
Anion gap: 4 — ABNORMAL LOW (ref 5–15)
BUN: 43 mg/dL — AB (ref 6–20)
CO2: 29 mmol/L (ref 22–32)
CREATININE: 1.86 mg/dL — AB (ref 0.44–1.00)
Calcium: 8.9 mg/dL (ref 8.9–10.3)
Chloride: 107 mmol/L (ref 101–111)
GFR calc Af Amer: 28 mL/min — ABNORMAL LOW (ref >60)
GFR, EST NON AFRICAN AMERICAN: 24 mL/min — AB (ref >60)
Glucose, Bld: 118 mg/dL — ABNORMAL HIGH (ref 65–99)
POTASSIUM: 4.6 mmol/L (ref 3.5–5.1)
SODIUM: 140 mmol/L (ref 135–145)

## 2016-04-22 LAB — CBC WITH DIFFERENTIAL/PLATELET
Basophils Absolute: 0.1 10*3/uL (ref 0–0.1)
Basophils Relative: 1 %
EOS ABS: 0 10*3/uL (ref 0–0.7)
EOS PCT: 0 %
HCT: 42.8 % (ref 35.0–47.0)
Hemoglobin: 14.1 g/dL (ref 12.0–16.0)
LYMPHS ABS: 1.6 10*3/uL (ref 1.0–3.6)
Lymphocytes Relative: 12 %
MCH: 29.2 pg (ref 26.0–34.0)
MCHC: 33 g/dL (ref 32.0–36.0)
MCV: 88.7 fL (ref 80.0–100.0)
Monocytes Absolute: 0.7 10*3/uL (ref 0.2–0.9)
Monocytes Relative: 6 %
Neutro Abs: 11.2 10*3/uL — ABNORMAL HIGH (ref 1.4–6.5)
Neutrophils Relative %: 81 %
PLATELETS: 144 10*3/uL — AB (ref 150–440)
RBC: 4.83 MIL/uL (ref 3.80–5.20)
RDW: 14.7 % — ABNORMAL HIGH (ref 11.5–14.5)
WBC: 13.6 10*3/uL — AB (ref 3.6–11.0)

## 2016-04-22 LAB — URINALYSIS COMPLETE WITH MICROSCOPIC (ARMC ONLY)
Bilirubin Urine: NEGATIVE
Glucose, UA: NEGATIVE mg/dL
Ketones, ur: NEGATIVE mg/dL
Nitrite: NEGATIVE
PROTEIN: 30 mg/dL — AB
Specific Gravity, Urine: 1.023 (ref 1.005–1.030)
Squamous Epithelial / LPF: NONE SEEN
pH: 5 (ref 5.0–8.0)

## 2016-04-22 LAB — TROPONIN I
TROPONIN I: 0.03 ng/mL — AB (ref <0.03)
TROPONIN I: 0.03 ng/mL — AB (ref <0.03)

## 2016-04-22 MED ORDER — CIPROFLOXACIN HCL 500 MG PO TABS
500.0000 mg | ORAL_TABLET | Freq: Two times a day (BID) | ORAL | 0 refills | Status: DC
Start: 2016-04-22 — End: 2016-04-26

## 2016-04-22 MED ORDER — CIPROFLOXACIN HCL 500 MG PO TABS
500.0000 mg | ORAL_TABLET | Freq: Once | ORAL | Status: AC
  Administered 2016-04-22: 500 mg via ORAL
  Filled 2016-04-22: qty 1

## 2016-04-22 MED ORDER — METRONIDAZOLE 500 MG PO TABS: 500.0000 mg | ORAL_TABLET | Freq: Three times a day (TID) | ORAL | 0 refills | Status: DC

## 2016-04-22 MED ORDER — SODIUM CHLORIDE 0.9 % IV BOLUS (SEPSIS)
1000.0000 mL | Freq: Once | INTRAVENOUS | Status: AC
  Administered 2016-04-22: 1000 mL via INTRAVENOUS

## 2016-04-22 MED ORDER — DEXTROSE 5 % IV SOLN
1.0000 g | Freq: Once | INTRAVENOUS | Status: AC
  Administered 2016-04-22: 1 g via INTRAVENOUS
  Filled 2016-04-22: qty 10

## 2016-04-22 MED ORDER — METRONIDAZOLE 500 MG PO TABS
500.0000 mg | ORAL_TABLET | Freq: Once | ORAL | Status: AC
  Administered 2016-04-22: 500 mg via ORAL
  Filled 2016-04-22: qty 1

## 2016-04-22 NOTE — ED Notes (Signed)
Contacted Floydene FlockChristy Tucker to notify her of pt's status but left a message due to no answer.

## 2016-04-22 NOTE — ED Notes (Signed)
Pt incontinent of urine and stool. Cleaned and new brief applied.

## 2016-04-22 NOTE — ED Notes (Signed)
Spoke to daughter [phone]). Updated her on pts status and will keep her updated.

## 2016-04-22 NOTE — Discharge Instructions (Signed)
You were diagnosed with a urinary tract infection and proctitis. Take the antibiotics prescribed to you fully. Follow-up with your doctor once you finish this treatment to undergo colonoscopy to evaluate a possible mass in her rectum. I have discussed these findings with the patient's nurse Artist BeachKathy Little and her daughter Floydene FlockChristy Tucker to ensure the patient receives appropriate follow-up to make sure she does not have rectal or colon cancer. Return to the emergency room if she has fever, worsening mental status, abdominal pain, nausea vomiting, or any other symptoms that are concerning to her caretakers.

## 2016-04-22 NOTE — ED Notes (Signed)
Contacted [facility] and spoke with Olegario MessierKathy Little with report given.

## 2016-04-22 NOTE — ED Provider Notes (Signed)
[facility] Emergency Department Provider Note  ____________________________________________  Time seen: Approximately 4:24 PM  I have reviewed the triage vital signs and the nursing notes.   HISTORY  Chief Complaint Weakness  Level 5 caveat:  Portions of the history and physical were unable to be obtained due to dementia   HPI Heather Kirk is a 82 y.o. female with h/o anemia and dementia who presents from [facility] for weakness, lethargy and BP 70/30. Patient has no complaints at this time and tells me she is hungry. She denies HA, CP, SOB, abdominal pain, nausea, vomiting. She is not sure why she is in the hospital. Patient is oriented to self only which according to EMS is her baseline.  Past Medical History:  Diagnosis Date  . Alzheimer's dementia   . Anxiety disorder   . Hypertension   . Iron deficiency anemia     There are no active problems to display for this patient.   Past Surgical History:  Procedure Laterality Date  . pelvis fracture      Prior to Admission medications   Medication Sig Start Date End Date Taking? Authorizing Provider  acetaminophen (TYLENOL) 650 MG CR tablet Take 650 mg by mouth 3 (three) times daily. [id number]   Yes Historical Provider, MD  aspirin EC 81 MG tablet Take 81 mg by mouth daily.   Yes Historical Provider, MD  cetirizine (ZYRTEC) 10 MG tablet Take 10 mg by mouth daily.   Yes Historical Provider, MD  citalopram (CELEXA) 20 MG tablet Take 20 mg by mouth daily.   Yes Historical Provider, MD  donepezil (ARICEPT) 10 MG tablet Take 10 mg by mouth at bedtime.   Yes Historical Provider, MD  ergocalciferol (VITAMIN D2) 50000 units capsule Take 50,000 Units by mouth every 30 (thirty) days. Due 04/23/2016   Yes Historical Provider, MD  QUEtiapine (SEROQUEL) 25 MG tablet Take 25 mg by mouth 2 (two) times daily.   Yes Historical Provider, MD  ciprofloxacin (CIPRO) 500 MG tablet Take 1 tablet (500  mg total) by mouth 2 (two) times daily. 04/22/16 05/02/16  Nita Sickle, MD  metroNIDAZOLE (FLAGYL) 500 MG tablet Take 1 tablet (500 mg total) by mouth 3 (three) times daily. 04/22/16 05/06/16  Nita Sickle, MD    Allergies Review of patient's allergies indicates no known allergies.  No family history on file.  Social History Social History  Substance Use Topics  . Smoking status: Never Smoker  . Smokeless tobacco: Never Used  . Alcohol use No    Review of Systems  Constitutional: Negative for fever. Eyes: Negative for visual changes. ENT: Negative for sore throat. Cardiovascular: Negative for chest pain. Respiratory: Negative for shortness of breath. Gastrointestinal: Negative for abdominal pain, vomiting or diarrhea. Genitourinary: Negative for dysuria. Musculoskeletal: Negative for back pain. Skin: Negative for rash. Neurological: Negative for headaches, weakness or numbness.  ____________________________________________   PHYSICAL EXAM:  VITAL SIGNS: ED Triage Vitals [04/22/16 1545]  Enc Vitals Group     BP 116/68     Pulse Rate 70     Resp 16     Temp 97.6 F (36.4 C)     Temp Source Axillary     SpO2 100 %     Weight 140 lb (63.5 kg)     Height 5\' 4"  (1.626 m)     Head Circumference      Peak Flow      Pain Score      Pain Loc  Pain Edu?      Excl. in GC?     Constitutional: Alert and oriented to self only. Well appearing and in no apparent distress. HEENT:      Head: Normocephalic and atraumatic.         Eyes: Conjunctivae are normal. Sclera is non-icteric. EOMI. PERRL      Mouth/Throat: Mucous membranes are dry.       Neck: Supple with no signs of meningismus. Cardiovascular: Regular rate and rhythm. No murmurs, gallops, or rubs. 2+ symmetrical distal pulses are present in all extremities. No JVD. Respiratory: Normal respiratory effort. Lungs are clear to auscultation bilaterally. No wheezes, crackles, or rhonchi.  Gastrointestinal:  Soft, non tender, and non distended with positive bowel sounds. No rebound or guarding. Genitourinary: No CVA tenderness. Musculoskeletal: Nontender with normal range of motion in all extremities. No edema, cyanosis, or erythema of extremities. Neurologic: Normal speech and language. Face is symmetric. Moving all extremities. No gross focal neurologic deficits are appreciated. Skin: Skin is warm, dry and intact. No rash noted. Psychiatric: Mood and affect are normal. Speech and behavior are normal.  ____________________________________________   LABS (all labs ordered are listed, but only abnormal results are displayed)  Labs Reviewed  CBC WITH DIFFERENTIAL/PLATELET - Abnormal; Notable for the following:       Result Value   WBC 13.6 (*)    RDW 14.7 (*)    Platelets 144 (*)    Neutro Abs 11.2 (*)    All other components within normal limits  BASIC METABOLIC PANEL - Abnormal; Notable for the following:    Glucose, Bld 118 (*)    BUN 43 (*)    Creatinine, Ser 1.86 (*)    GFR calc non Af Amer 24 (*)    GFR calc Af Amer 28 (*)    Anion gap 4 (*)    All other components within normal limits  URINALYSIS COMPLETEWITH MICROSCOPIC ([facility] ONLY) - Abnormal; Notable for the following:    Color, Urine AMBER (*)    APPearance CLOUDY (*)    Hgb urine dipstick 3+ (*)    Protein, ur 30 (*)    Leukocytes, UA 3+ (*)    Bacteria, UA MANY (*)    All other components within normal limits  TROPONIN I - Abnormal; Notable for the following:    Troponin I 0.03 (*)    All other components within normal limits  TROPONIN I - Abnormal; Notable for the following:    Troponin I 0.03 (*)    All other components within normal limits  URINE CULTURE   ____________________________________________  EKG  ED ECG REPORT I, Nita Sicklearolina Colen Eltzroth, the attending physician, personally viewed and interpreted this ECG.  Normal sinus rhythm, rate of 72, first-degree AV block, prolonged QTC of 502, left axis  deviation, no ST elevations or depressions, low voltage. Unchanged from prior. ____________________________________________  RADIOLOGY  CXR: 1. Stable mild bibasilar scarring versus atelectasis. 2. Possible small left pleural effusion. 3. Stable moderate to large hiatal hernia. 4. Aortic atherosclerosis.   CT renal: 1. Wall thickening at the rectum raises concern for proctitis. An underlying mass cannot be entirely excluded. Sigmoidoscopy could be considered for further evaluation, as deemed clinically appropriate. 2. Large hiatal hernia, containing much of the stomach. 3. Mild left basilar atelectasis or scarring noted. 4. Scattered aortic atherosclerosis. 5. Scattered bilateral renal cysts, both hypodense and hyperdense in nature. Mild bilateral renal atrophy noted. 6. Mild chronic loss of height at vertebral body L1. ____________________________________________  PROCEDURES  Procedure(s) performed: None Procedures Critical Care performed:  None ____________________________________________   INITIAL IMPRESSION / ASSESSMENT AND PLAN / ED COURSE  82 y.o. female with h/o anemia and dementia who presents from [facility] for weakness, lethargy and BP 70/30. Patient with no complaints. Looks dehydrated with dry mucous membranes. VS WNL with no hypotension here. Will give IVF, check labs, UA for any signs of dehydration, electrolyte abnormalities, or infection.  Clinical Course  Comment By Time  UA positive for urinary tract infection and also with blood. Since patient is demented a CT renal was done to rule out possible obstructing stone. CT was negative for stone but concerning for proctitis. Will send patient on flagyl and cipro that will cover for both and f/u for colonoscopy to rule out underlying mass. Remaining of work up WNL.  Nita Sickle, MD 09/23 2035   I spoke with patient's nurse at her facility, nurse Artist [place] and also patient's daughter Floydene Flock about the findings of the CT scan for possible rectal mass and recommended that they take patient to follow-up with her PCP in one week for outpatient colonoscopy to better evaluate the possibility of colon/rectal cancer. Daughter will make sure that follow-up is arranged for patient.   Pertinent labs & imaging results that were available during my care of the patient were reviewed by me and considered in my medical decision making (see chart for details).    ____________________________________________   FINAL CLINICAL IMPRESSION(S) / ED DIAGNOSES  Final diagnoses:  UTI (lower urinary tract infection)  Proctitis      NEW MEDICATIONS STARTED DURING THIS VISIT:  New Prescriptions   CIPROFLOXACIN (CIPRO) 500 MG TABLET    Take 1 tablet (500 mg total) by mouth 2 (two) times daily.   METRONIDAZOLE (FLAGYL) 500 MG TABLET    Take 1 tablet (500 mg total) by mouth 3 (three) times daily.     Note:  This document was prepared using Dragon voice recognition software and may include unintentional dictation errors.    Nita Sickle, MD 04/22/16 2056

## 2016-04-22 NOTE — ED Triage Notes (Signed)
[facility] ED [facility] EMS [facility]. Pt sent over to ED for weakness and lethargy. [facility], pt bp 70/30. Upon arrival pts bp 116/68. Pt history of dementia.

## 2016-04-22 NOTE — ED Notes (Signed)
Pt offered food and drink. Pt had small amount of apple sauce and cranberry juice. Denied wanting sandwich or anything else.

## 2016-04-24 DIAGNOSIS — N390 Urinary tract infection, site not specified: Secondary | ICD-10-CM

## 2016-04-24 DIAGNOSIS — K6289 Other specified diseases of anus and rectum: Secondary | ICD-10-CM

## 2016-04-24 LAB — URINE CULTURE

## 2016-04-25 ENCOUNTER — Inpatient Hospital Stay
Admission: EM | Admit: 2016-04-25 | Discharge: 2016-04-26 | DRG: 871 | Disposition: A | Discharge status: Hospice | Payer: Medicare Other | Attending: Internal Medicine | Admitting: Internal Medicine

## 2016-04-25 ENCOUNTER — Emergency Department: Payer: Medicare Other

## 2016-04-25 ENCOUNTER — Encounter: Payer: Self-pay | Admitting: Emergency Medicine

## 2016-04-25 DIAGNOSIS — K219 Gastro-esophageal reflux disease without esophagitis: Secondary | ICD-10-CM | POA: Diagnosis present

## 2016-04-25 DIAGNOSIS — E46 Unspecified protein-calorie malnutrition: Secondary | ICD-10-CM | POA: Diagnosis present

## 2016-04-25 DIAGNOSIS — I9589 Other hypotension: Secondary | ICD-10-CM

## 2016-04-25 DIAGNOSIS — R569 Unspecified convulsions: Secondary | ICD-10-CM | POA: Diagnosis present

## 2016-04-25 DIAGNOSIS — F419 Anxiety disorder, unspecified: Secondary | ICD-10-CM | POA: Diagnosis present

## 2016-04-25 DIAGNOSIS — N390 Urinary tract infection, site not specified: Secondary | ICD-10-CM | POA: Diagnosis present

## 2016-04-25 DIAGNOSIS — G309 Alzheimer's disease, unspecified: Secondary | ICD-10-CM | POA: Diagnosis present

## 2016-04-25 DIAGNOSIS — Z6823 Body mass index (BMI) 23.0-23.9, adult: Secondary | ICD-10-CM | POA: Diagnosis not present

## 2016-04-25 DIAGNOSIS — F0280 Dementia in other diseases classified elsewhere without behavioral disturbance: Secondary | ICD-10-CM | POA: Diagnosis present

## 2016-04-25 DIAGNOSIS — R4182 Altered mental status, unspecified: Secondary | ICD-10-CM | POA: Diagnosis present

## 2016-04-25 DIAGNOSIS — I639 Cerebral infarction, unspecified: Secondary | ICD-10-CM | POA: Diagnosis present

## 2016-04-25 DIAGNOSIS — Z79899 Other long term (current) drug therapy: Secondary | ICD-10-CM | POA: Diagnosis not present

## 2016-04-25 DIAGNOSIS — I959 Hypotension, unspecified: Secondary | ICD-10-CM | POA: Diagnosis present

## 2016-04-25 DIAGNOSIS — I10 Essential (primary) hypertension: Secondary | ICD-10-CM | POA: Diagnosis present

## 2016-04-25 DIAGNOSIS — R410 Disorientation, unspecified: Secondary | ICD-10-CM

## 2016-04-25 DIAGNOSIS — Z66 Do not resuscitate: Secondary | ICD-10-CM | POA: Diagnosis present

## 2016-04-25 DIAGNOSIS — D509 Iron deficiency anemia, unspecified: Secondary | ICD-10-CM | POA: Diagnosis present

## 2016-04-25 DIAGNOSIS — A419 Sepsis, unspecified organism: Secondary | ICD-10-CM | POA: Diagnosis not present

## 2016-04-25 DIAGNOSIS — N179 Acute kidney failure, unspecified: Secondary | ICD-10-CM | POA: Diagnosis present

## 2016-04-25 DIAGNOSIS — Z7982 Long term (current) use of aspirin: Secondary | ICD-10-CM

## 2016-04-25 DIAGNOSIS — E876 Hypokalemia: Secondary | ICD-10-CM | POA: Diagnosis present

## 2016-04-25 DIAGNOSIS — Z515 Encounter for palliative care: Secondary | ICD-10-CM | POA: Diagnosis present

## 2016-04-25 DIAGNOSIS — R627 Adult failure to thrive: Secondary | ICD-10-CM | POA: Diagnosis present

## 2016-04-25 DIAGNOSIS — G9349 Other encephalopathy: Secondary | ICD-10-CM | POA: Diagnosis present

## 2016-04-25 LAB — CBC WITH DIFFERENTIAL/PLATELET
BASOS ABS: 0.1 10*3/uL (ref 0–0.1)
BASOS PCT: 1 %
Eosinophils Absolute: 0 10*3/uL (ref 0–0.7)
Eosinophils Relative: 0 %
HEMATOCRIT: 44.4 % (ref 35.0–47.0)
Hemoglobin: 13.8 g/dL (ref 12.0–16.0)
LYMPHS PCT: 22 %
Lymphs Abs: 4 10*3/uL — ABNORMAL HIGH (ref 1.0–3.6)
MCH: 28.4 pg (ref 26.0–34.0)
MCHC: 31.1 g/dL — ABNORMAL LOW (ref 32.0–36.0)
MCV: 91.1 fL (ref 80.0–100.0)
Monocytes Absolute: 1.7 10*3/uL — ABNORMAL HIGH (ref 0.2–0.9)
Monocytes Relative: 10 %
NEUTROS ABS: 12 10*3/uL — AB (ref 1.4–6.5)
Neutrophils Relative %: 67 %
PLATELETS: 134 10*3/uL — AB (ref 150–440)
RBC: 4.88 MIL/uL (ref 3.80–5.20)
RDW: 15.9 % — ABNORMAL HIGH (ref 11.5–14.5)
WBC: 17.8 10*3/uL — AB (ref 3.6–11.0)

## 2016-04-25 LAB — URINALYSIS COMPLETE WITH MICROSCOPIC (ARMC ONLY)
Bilirubin Urine: NEGATIVE
GLUCOSE, UA: NEGATIVE mg/dL
HGB URINE DIPSTICK: NEGATIVE
KETONES UR: NEGATIVE mg/dL
NITRITE: NEGATIVE
Protein, ur: NEGATIVE mg/dL
SPECIFIC GRAVITY, URINE: 1.016 (ref 1.005–1.030)
pH: 5 (ref 5.0–8.0)

## 2016-04-25 LAB — COMPREHENSIVE METABOLIC PANEL
ALT: 10 U/L — AB (ref 14–54)
AST: 28 U/L (ref 15–41)
Albumin: 1.8 g/dL — ABNORMAL LOW (ref 3.5–5.0)
Alkaline Phosphatase: 68 U/L (ref 38–126)
Anion gap: 11 (ref 5–15)
BILIRUBIN TOTAL: 0.1 mg/dL — AB (ref 0.3–1.2)
BUN: 27 mg/dL — AB (ref 6–20)
CALCIUM: 7.2 mg/dL — AB (ref 8.9–10.3)
CHLORIDE: 117 mmol/L — AB (ref 101–111)
CO2: 17 mmol/L — ABNORMAL LOW (ref 22–32)
CREATININE: 2.15 mg/dL — AB (ref 0.44–1.00)
GFR, EST AFRICAN AMERICAN: 24 mL/min — AB (ref >60)
GFR, EST NON AFRICAN AMERICAN: 20 mL/min — AB (ref >60)
Glucose, Bld: 187 mg/dL — ABNORMAL HIGH (ref 65–99)
Potassium: 2.7 mmol/L — CL (ref 3.5–5.1)
Sodium: 145 mmol/L (ref 135–145)
TOTAL PROTEIN: 4.1 g/dL — AB (ref 6.5–8.1)

## 2016-04-25 LAB — TROPONIN I: TROPONIN I: 0.51 ng/mL — AB (ref <0.03)

## 2016-04-25 LAB — LACTIC ACID, PLASMA
LACTIC ACID, VENOUS: 3 mmol/L — AB (ref 0.5–1.9)
Lactic Acid, Venous: 5.8 mmol/L (ref 0.5–1.9)

## 2016-04-25 LAB — LIPASE, BLOOD: LIPASE: 14 U/L (ref 11–51)

## 2016-04-25 LAB — PROTIME-INR
INR: 1.36
Prothrombin Time: 16.9 seconds — ABNORMAL HIGH (ref 11.4–15.2)

## 2016-04-25 LAB — APTT: APTT: 26 s (ref 24–36)

## 2016-04-25 MED ORDER — ACETAMINOPHEN 650 MG RE SUPP: 650.0000 mg | Freq: Four times a day (QID) | RECTAL | Status: DC | PRN

## 2016-04-25 MED ORDER — ONDANSETRON HCL 4 MG/2ML IJ SOLN: 4.0000 mg | Freq: Four times a day (QID) | INTRAMUSCULAR | Status: DC | PRN

## 2016-04-25 MED ORDER — LORAZEPAM 2 MG/ML PO CONC: 1.0000 mg | ORAL | Status: DC | PRN

## 2016-04-25 MED ORDER — SODIUM CHLORIDE 0.9 % IV BOLUS (SEPSIS)
1000.0000 mL | Freq: Once | INTRAVENOUS | Status: AC
  Administered 2016-04-25: 1000 mL via INTRAVENOUS

## 2016-04-25 MED ORDER — LORAZEPAM 1 MG PO TABS: 1.0000 mg | ORAL_TABLET | ORAL | Status: DC | PRN

## 2016-04-25 MED ORDER — LACTATED RINGERS IV BOLUS (SEPSIS)
2000.0000 mL | Freq: Once | INTRAVENOUS | Status: AC
  Administered 2016-04-25: 2000 mL via INTRAVENOUS
  Filled 2016-04-25: qty 2000

## 2016-04-25 MED ORDER — ONDANSETRON HCL 4 MG PO TABS: 4.0000 mg | ORAL_TABLET | Freq: Four times a day (QID) | ORAL | Status: DC | PRN

## 2016-04-25 MED ORDER — DEXTROSE 5 % IV SOLN
2.0000 g | Freq: Once | INTRAVENOUS | Status: AC
  Administered 2016-04-25: 2 g via INTRAVENOUS
  Filled 2016-04-25: qty 2

## 2016-04-25 MED ORDER — GLYCOPYRROLATE 1 MG PO TABS
1.0000 mg | ORAL_TABLET | ORAL | Status: DC | PRN
  Filled 2016-04-25: qty 1

## 2016-04-25 MED ORDER — ACYCLOVIR SODIUM 50 MG/ML IV SOLN
10.0000 mg/kg | Freq: Once | INTRAVENOUS | Status: AC
  Administered 2016-04-25: 635 mg via INTRAVENOUS
  Filled 2016-04-25: qty 12.7

## 2016-04-25 MED ORDER — GLYCOPYRROLATE 0.2 MG/ML IJ SOLN
0.2000 mg | INTRAMUSCULAR | Status: DC | PRN
  Filled 2016-04-25: qty 1

## 2016-04-25 MED ORDER — HALOPERIDOL LACTATE 5 MG/ML IJ SOLN
INTRAMUSCULAR | Status: AC
  Administered 2016-04-25: 2.5 mg via INTRAVENOUS
  Filled 2016-04-25: qty 1

## 2016-04-25 MED ORDER — ACETAMINOPHEN 325 MG PO TABS: 650.0000 mg | ORAL_TABLET | Freq: Four times a day (QID) | ORAL | Status: DC | PRN

## 2016-04-25 MED ORDER — VANCOMYCIN HCL IN DEXTROSE 1-5 GM/200ML-% IV SOLN
1000.0000 mg | Freq: Once | INTRAVENOUS | Status: AC
  Administered 2016-04-25: 1000 mg via INTRAVENOUS
  Filled 2016-04-25: qty 200

## 2016-04-25 MED ORDER — LORAZEPAM 2 MG/ML IJ SOLN
2.0000 mg | Freq: Once | INTRAMUSCULAR | Status: AC
  Administered 2016-04-25: 2 mg via INTRAVENOUS

## 2016-04-25 MED ORDER — MORPHINE SULFATE (PF) 2 MG/ML IV SOLN
1.0000 mg | INTRAVENOUS | Status: DC | PRN
  Administered 2016-04-25 – 2016-04-26 (×6): 1 mg via INTRAVENOUS
  Filled 2016-04-25 (×7): qty 1

## 2016-04-25 MED ORDER — HALOPERIDOL LACTATE 5 MG/ML IJ SOLN
2.5000 mg | Freq: Once | INTRAMUSCULAR | Status: AC
  Administered 2016-04-25: 2.5 mg via INTRAVENOUS

## 2016-04-25 MED ORDER — LORAZEPAM 2 MG/ML IJ SOLN
INTRAMUSCULAR | Status: AC
  Administered 2016-04-25: 2 mg via INTRAVENOUS
  Filled 2016-04-25: qty 1

## 2016-04-25 MED ORDER — CEFTRIAXONE SODIUM 1 G IJ SOLR
INTRAMUSCULAR | Status: AC
  Administered 2016-04-25: 19:00:00
  Filled 2016-04-25: qty 10

## 2016-04-25 MED ORDER — LORAZEPAM 2 MG/ML IJ SOLN
1.0000 mg | INTRAMUSCULAR | Status: DC | PRN
  Administered 2016-04-26 (×2): 1 mg via INTRAVENOUS
  Filled 2016-04-25 (×3): qty 1

## 2016-04-25 MED ORDER — SODIUM CHLORIDE 0.9 % IV SOLN
1000.0000 mg | INTRAVENOUS | Status: AC
  Administered 2016-04-25: 1000 mg via INTRAVENOUS
  Filled 2016-04-25: qty 10

## 2016-04-25 NOTE — Progress Notes (Signed)
Pt received 5,000 ml boluses in the ED, and pt has not voided.  Bladder scan revealed only 92ml.  Pt is not in distress or seem uncomfortable.  Will continue to monitor patient closely, but no interventions at this point per Dr. Anne HahnWillis.

## 2016-04-25 NOTE — ED Notes (Signed)
Patient transported to CT, verbal okay by MD.

## 2016-04-25 NOTE — H&P (Signed)
[employer] [facility] [place] [facility]   PATIENT NAME: Heather Kirk    MR#:  [id number]  DATE OF BIRTH:  Aug 10, 1932  DATE OF ADMISSION:  04/25/2016  PRIMARY CARE PHYSICIAN: [name], MD   REQUESTING/REFERRING PHYSICIAN: Dr. Alfonse Flavors  CHIEF COMPLAINT:   Chief Complaint  Patient presents with  . Altered Mental Status  . Seizures    HISTORY OF PRESENT ILLNESS:  Heather Kirk  is a 82 y.o. female with a known history of Advanced Alzheimer's dementia, anxiety, hypertension, history of iron deficiency anemia who presented to the hospital due to altered mental status. Patient herself has advanced dementia and therefore most history obtained from the daughter over the phone. As per the daughter patient's health has been slowly declining over the past few weeks. Patient has not been eating and drinking well and has been refusing take his medications at the skilled nursing facility. She presented to the emergency room a few days back was noted to have urinary tract infection discharged on oral ciprofloxacin but has not been taking it at the facility. Today she was more confused and altered and therefore sent to the ER for further evaluation. In the emergency room initially patient was noted to be severely hypotensive. Her urinalysis is also negative for UTI. She was noted to be in acute kidney injury. Patient CT scan of the head did show a acute/subacute occipital CVA. Given her progressive decline in health and her mental status change, the daughter does not want to pursue aggressive care and wants to keep the patient comfortable. Patient is being admitted to the hospital under comfort care measures presently.  PAST MEDICAL HISTORY:   Past Medical History:  Diagnosis Date  . Alzheimer's dementia   . Anxiety disorder   . Hypertension   . Iron deficiency anemia     PAST SURGICAL HISTORY:   Past Surgical History:  Procedure Laterality Date  . pelvis  fracture      SOCIAL HISTORY:   Social History  Substance Use Topics  . Smoking status: Never Smoker  . Smokeless tobacco: Never Used  . Alcohol use No    FAMILY HISTORY:  No family history on file.  Unable to obtain given pt's mental status. No family hx of DM, HTN, CAD, CVA.   DRUG ALLERGIES:  No Known Allergies  REVIEW OF SYSTEMS:   Review of Systems  Unable to perform ROS: Dementia    MEDICATIONS AT HOME:   Prior to Admission medications   Medication Sig Start Date End Date Taking? Authorizing Provider  acetaminophen (TYLENOL) 650 MG CR tablet Take 650 mg by mouth 3 (three) times daily. [id number]    Historical Provider, MD  aspirin EC 81 MG tablet Take 81 mg by mouth daily.    Historical Provider, MD  cetirizine (ZYRTEC) 10 MG tablet Take 10 mg by mouth daily.    Historical Provider, MD  ciprofloxacin (CIPRO) 500 MG tablet Take 1 tablet (500 mg total) by mouth 2 (two) times daily. 04/22/16 05/02/16  Nita Sickle, MD  citalopram (CELEXA) 20 MG tablet Take 20 mg by mouth daily.    Historical Provider, MD  donepezil (ARICEPT) 10 MG tablet Take 10 mg by mouth at bedtime.    Historical Provider, MD  ergocalciferol (VITAMIN D2) 50000 units capsule Take 50,000 Units by mouth every 30 (thirty) days. Due 04/23/2016    Historical Provider, MD  metroNIDAZOLE (FLAGYL) 500 MG tablet Take 1 tablet (500 mg total) by mouth 3 (three) times  daily. 04/22/16 05/06/16  Nita Sicklearolina Veronese, MD  QUEtiapine (SEROQUEL) 25 MG tablet Take 25 mg by mouth 2 (two) times daily.    Historical Provider, MD      VITAL SIGNS:  Blood pressure 90/66, pulse 100, resp. rate 16, weight 63.5 kg (140 lb), SpO2 100 %.  PHYSICAL EXAMINATION:  Physical Exam  GENERAL:  82 y.o.-year-old patient lying in the bed lethargic/encephalpathic.  EYES: Pupils equal, round, reactive to light.. No scleral icterus. Extraocular muscles intact.  HEENT: Head atraumatic, normocephalic. Oropharynx and nasopharynx clear. No  oropharyngeal erythema, dry oral mucosa  NECK:  Supple, no jugular venous distention. No thyroid enlargement, no tenderness.  LUNGS: Normal breath sounds bilaterally, no wheezing, rales, rhonchi. No use of accessory muscles of respiration.  CARDIOVASCULAR: S1, S2 RRR. No murmurs, rubs, gallops, clicks.  ABDOMEN: Soft, nontender, nondistended. Bowel sounds present. No organomegaly or mass.  EXTREMITIES: No pedal edema, cyanosis, or clubbing. + 2 pedal & radial pulses b/l.   NEUROLOGIC: Difficult to a good Neuro exam as pt. Is Encephlopathic PSYCHIATRIC: The patient is alert and oriented x 1. SKIN: No obvious rash, lesion, or ulcer.   LABORATORY PANEL:   CBC  Recent Labs Lab 04/25/16 1407  WBC 17.8*  HGB 13.8  HCT 44.4  PLT 134*   ------------------------------------------------------------------------------------------------------------------  Chemistries   Recent Labs Lab 04/25/16 1437  NA 145  K 2.7*  CL 117*  CO2 17*  GLUCOSE 187*  BUN 27*  CREATININE 2.15*  CALCIUM 7.2*  AST 28  ALT 10*  ALKPHOS 68  BILITOT 0.1*   ------------------------------------------------------------------------------------------------------------------  Cardiac Enzymes  Recent Labs Lab 04/25/16 1437  TROPONINI 0.51*   ------------------------------------------------------------------------------------------------------------------  RADIOLOGY:  Ct Head Wo Contrast  Result Date: 04/25/2016 CLINICAL DATA:  Seizure with altered mental status EXAM: CT HEAD WITHOUT CONTRAST TECHNIQUE: Contiguous axial images were obtained from the base of the skull through the vertex without intravenous contrast. COMPARISON:  April 19, 2012 FINDINGS: Brain: Moderate diffuse atrophy is again noted. There is no intracranial mass, hemorrhage, extra-axial fluid collection, or midline shift. There is evidence of a prior infarct in the medial right occipital lobe. There is mild ex vacuo phenomenon involving  the atrium of the right lateral ventricle in this area. There is extensive small vessel disease throughout the centra semiovale bilaterally. There is evidence of an infarct in the upper mid right occipital lobe which appears recent and may well be acute. No other acute appearing infarct evident. Vascular: There is no hyperdense vessel evident. There is no appreciable vascular calcification. Skull: The bony calvarium appears intact. Sinuses/Orbits: There is mild mucosal thickening in several ethmoid air cells bilaterally. Other paranasal sinuses are clear. There is rightward deviation of the mid nasal septum. Orbits appear symmetric bilaterally. Other: Visualized mastoid air cells are clear. IMPRESSION: Suspect recent/acute infarct in the upper mid right occipital lobe. There is a prior infarct in the medial right occipital lobe with nearby right lateral ventricular encephalomalacia. There is elsewhere generalized atrophy with extensive periventricular small vessel disease. There is no mass, hemorrhage, or extra-axial fluid collection. There is mild mucosal thickening in several ethmoid air cells bilaterally. Electronically Signed   By: Bretta BangWilliam  Woodruff III M.D.   On: 04/25/2016 16:16   Dg Chest Port 1 View  Result Date: 04/25/2016 CLINICAL DATA:  Altered mental status EXAM: PORTABLE CHEST 1 VIEW COMPARISON:  04/22/2016 chest radiograph. FINDINGS: Stable cardiomediastinal silhouette with mild cardiomegaly, aortic atherosclerosis and large hiatal hernia. No pneumothorax. No pleural effusion. No pulmonary edema.  Stable mild compressive atelectasis in the medial lower lobes. Stable blunting of the left costophrenic angle, consistent with pleural-parenchymal scarring as seen on recent CT study. No acute consolidative airspace disease. IMPRESSION: Stable chest radiograph with mild cardiomegaly without pulmonary edema, large hiatal hernia with mild bibasilar atelectasis and aortic atherosclerosis. Electronically  Signed   By: Delbert Phenix M.D.   On: 04/25/2016 14:57     IMPRESSION AND PLAN:   82 year old female with past medical history of advanced dementia, hypertension, Anxiety, iron deficiency anemia presents to the hospital due to altered mental status.  1. Altered mental status 2. Acute/subacute CVA 3. Advanced dementia 4. Acute Kidney Injury 5. Adult Failure to Thrive 6. Hypokalemia 7. Hypotension/sepsis 8. Recent UTI.   Patient's health has been overall declining over the past few weeks as per the daughter. She does not want to pursue aggressive care and her goal is to keep her mother comfortable. I had an extensive discussion in differentiating DO NOT RESUSCITATE and comfort care with over the phone. Patient is now being admitted under comfort care measures. I have placed a palliative care consult for the morning, we'll also consult care management to help assist with this.  -As needed Ativan, morphine ordered for comfort. Glycopyrrolate also ordered for secretions.  All the records are reviewed and case discussed with ED provider. Management plans discussed with the patient, family and they are in agreement.  CODE STATUS: DO NOT RESUSCITATE  TOTAL TIME TAKING CARE OF THIS PATIENT: 45 minutes.    Houston Siren M.D on 04/25/2016 at 5:32 PM  Between 7am to 6pm - Pager - [phone]  After 6pm go to [email or web address] - password [email or web address] [facility]  [facility]  Office  [phone]  CC: Primary care physician; [name], MD

## 2016-04-25 NOTE — ED Provider Notes (Signed)
[facility] Emergency Department Provider Note  ____________________________________________  Time seen: Approximately 2:08 PM  I have reviewed the triage vital signs and the nursing notes.   HISTORY  Chief Complaint Altered Mental Status and Seizures  Level 5 caveat:  Portions of the history and physical were unable to be obtained due to the patient's acute illness and dementia  HPI Heather Kirk is a 82 y.o. female sent to the ED for altered mental status and possible seizure. She was recently diagnosed with a urinary tract infection but has not been receiving antibiotics because she has been refusing them     Past Medical History:  Diagnosis Date  . Alzheimer's dementia   . Anxiety disorder   . Hypertension   . Iron deficiency anemia      There are no active problems to display for this patient.    Past Surgical History:  Procedure Laterality Date  . pelvis fracture       Prior to Admission medications   Medication Sig Start Date End Date Taking? Authorizing Provider  acetaminophen (TYLENOL) 650 MG CR tablet Take 650 mg by mouth 3 (three) times daily. [id number]    Historical Provider, MD  aspirin EC 81 MG tablet Take 81 mg by mouth daily.    Historical Provider, MD  cetirizine (ZYRTEC) 10 MG tablet Take 10 mg by mouth daily.    Historical Provider, MD  ciprofloxacin (CIPRO) 500 MG tablet Take 1 tablet (500 mg total) by mouth 2 (two) times daily. 04/22/16 05/02/16  Nita Sicklearolina Veronese, MD  citalopram (CELEXA) 20 MG tablet Take 20 mg by mouth daily.    Historical Provider, MD  donepezil (ARICEPT) 10 MG tablet Take 10 mg by mouth at bedtime.    Historical Provider, MD  ergocalciferol (VITAMIN D2) 50000 units capsule Take 50,000 Units by mouth every 30 (thirty) days. Due 04/23/2016    Historical Provider, MD  metroNIDAZOLE (FLAGYL) 500 MG tablet Take 1 tablet (500 mg total) by mouth 3 (three) times daily. 04/22/16 05/06/16  Nita Sicklearolina  Veronese, MD  QUEtiapine (SEROQUEL) 25 MG tablet Take 25 mg by mouth 2 (two) times daily.    Historical Provider, MD     Allergies Review of patient's allergies indicates no known allergies.   No family history on file.  Social History Social History  Substance Use Topics  . Smoking status: Never Smoker  . Smokeless tobacco: Never Used  . Alcohol use No    Review of Systems Unable to obtain due to altered mental status..  ____________________________________________   PHYSICAL EXAM:  VITAL SIGNS: ED Triage Vitals  Enc Vitals Group     BP --      Pulse --      Resp 04/25/16 1356 (!) 23     Temp --      Temp src --      SpO2 04/25/16 1356 99 %     Weight 04/25/16 1403 140 lb (63.5 kg)     Height --      Head Circumference --      Peak Flow --      Pain Score --      Pain Loc --      Pain Edu? --      Excl. in GC? --     Vital signs reviewed, nursing assessments reviewed.   Constitutional:   Awake, disoriented and confused. Not interactive.. Yelling nonstop. Eyes:   No scleral icterus. No conjunctival pallor. PERRL ENT   Head:  Normocephalic and atraumatic.   Nose:   No congestion/rhinnorhea. No septal hematoma   Mouth/Throat:   Dry mucous membranes, no pharyngeal erythema. No peritonsillar mass.    Neck:   No stridor. No SubQ emphysema. No meningismus. Hematological/Lymphatic/Immunilogical:   No cervical lymphadenopathy. Cardiovascular:   Irregularly irregular rhythm, heart rate 1:15.. Symmetric bilateral radial and DP pulses.  No murmurs.  Respiratory:   Normal respiratory effort without tachypnea nor retractions. Breath sounds are clear and equal bilaterally. No wheezes/rales/rhonchi. Gastrointestinal:   Soft and nontender. Non distended. There is no CVA tenderness.  No rebound, rigidity, or guarding. Genitourinary:   deferred Musculoskeletal:   Nontender with normal range of motion in all extremities. No joint effusions.  No lower extremity  tenderness.  No edema. Neurologic:   Rapid Incoherent speech consistent with Alzheimer's dementia CN 2-10 normal. Motor grossly intact. No gross focal neurologic deficits are appreciated.  Skin:    Skin is warm, dry and intact. No rash noted.  No petechiae, purpura, or bullae. Poor skin turgor  ____________________________________________    LABS (pertinent positives/negatives) (all labs ordered are listed, but only abnormal results are displayed) Labs Reviewed  CBC WITH DIFFERENTIAL/PLATELET - Abnormal; Notable for the following:       Result Value   WBC 17.8 (*)    MCHC 31.1 (*)    RDW 15.9 (*)    Platelets 134 (*)    Neutro Abs 12.0 (*)    Lymphs Abs 4.0 (*)    Monocytes Absolute 1.7 (*)    All other components within normal limits  URINALYSIS COMPLETEWITH MICROSCOPIC ([facility] ONLY) - Abnormal; Notable for the following:    Color, Urine YELLOW (*)    APPearance CLEAR (*)    Leukocytes, UA TRACE (*)    Bacteria, UA RARE (*)    Squamous Epithelial / LPF 0-5 (*)    All other components within normal limits  TROPONIN I - Abnormal; Notable for the following:    Troponin I 0.51 (*)    All other components within normal limits  COMPREHENSIVE METABOLIC PANEL - Abnormal; Notable for the following:    Potassium 2.7 (*)    Chloride 117 (*)    CO2 17 (*)    Glucose, Bld 187 (*)    BUN 27 (*)    Creatinine, Ser 2.15 (*)    Calcium 7.2 (*)    Total Protein 4.1 (*)    Albumin 1.8 (*)    ALT 10 (*)    Total Bilirubin 0.1 (*)    GFR calc non Af Amer 20 (*)    GFR calc Af Amer 24 (*)    All other components within normal limits  PROTIME-INR - Abnormal; Notable for the following:    Prothrombin Time 16.9 (*)    All other components within normal limits  CULTURE, BLOOD (ROUTINE X 2)  CULTURE, BLOOD (ROUTINE X 2)  CULTURE, EXPECTORATED SPUTUM-ASSESSMENT  URINE CULTURE  LIPASE, BLOOD  APTT  LACTIC ACID, PLASMA  LACTIC ACID, PLASMA    ____________________________________________   EKG    ____________________________________________    RADIOLOGY  Chest x-ray unremarkable CT head pending  ____________________________________________   PROCEDURES Procedures CRITICAL CARE Performed by: Scotty Court, Tiberius Loftus   Total critical care time: 45 minutes  Critical care time was exclusive of separately billable procedures and treating other patients.  Critical care was necessary to treat or prevent imminent or life-threatening deterioration.  Critical care was time spent personally by me on the following activities: development of treatment  plan with patient and/or surrogate as well as nursing, discussions with consultants, evaluation of patient's response to treatment, examination of patient, obtaining history from patient or surrogate, ordering and performing treatments and interventions, ordering and review of laboratory studies, ordering and review of radiographic studies, pulse oximetry and re-evaluation of patient's condition.  ____________________________________________   INITIAL IMPRESSION / ASSESSMENT AND PLAN / ED COURSE  Pertinent labs & imaging results that were available during my care of the patient were reviewed by me and considered in my medical decision making (see chart for details).  Patient presents with altered mental status in the setting of inadequately treated urinary tract infection. She is unable to provide any history or participate in exam. Because of her agitation, she was given IV Haldol to help facilitate her evaluation. Subsequently, she did have a witnessed seizure while I was at the bedside. This lasted 20-30 seconds, gaze fixed to the left with generalized quivering motion. This resolved spontaneously. She was then given IV Ativan. We'll give fluids and IV Keppra while getting labs chest x-ray and CT scan of the head. CODE STATUS is DO NOT RESUSCITATE, so I am restricted from  intubating the patient for airway protection and to facilitate workup for a possible intracranial hemorrhage. Given her advanced dementia and age, it is unlikely that she would be considered a surgical candidate even if she did have a significant intracranial hemorrhage.     Clinical Course  Comment By Time  Pt now calm after haldol and ativan. Bp 65/40. Hr 150. Aggressive IVF 53ml/kg.  Sharman Cheek, MD 09/26 1419  Discussed with family including CODE STATUS and invasive procedures and further management. They agree that we are currently at maximal therapy with IV fluids and antibiotics. If she decompensates further, she is likely to expire from this current illness. Currently heart rate is 110, blood pressure is about 80/50. Sharman Cheek, MD 09/26 1440  Urinalysis unremarkable, chest x-ray unremarkable. Leukocytosis of 18,000. We'll broaden antimicrobial so cefepime and vancomycin and acyclovir. Sharman Cheek, MD 09/26 1506  Bp 100/70, hr 110. Cont. Ivf. D/w ICU for admission planning. Sharman Cheek, MD 09/26 1535    ----------------------------------------- 3:45 PM on 04/25/2016 -----------------------------------------  Discussed with hospitalist for admission planning. Lactate and CT head still pending. ____________________________________________   FINAL CLINICAL IMPRESSION(S) / ED DIAGNOSES  Final diagnoses:  Sepsis, due to unspecified organism [facility])  Delirium  Seizure (HCC)  Other specified hypotension       Portions of this note were generated with dragon dictation software. Dictation errors may occur despite best attempts at proofreading.    Sharman Cheek, MD 04/25/16 [phone]

## 2016-04-25 NOTE — ED Triage Notes (Addendum)
Pt to ED via EMS [facility] called out for unresponsive.  Per EMS patient had possible seizure like activity with short period of unresponsiveness and then not at baseline per facility.  EMS states patient dx UTI last week and prescribed antibiotics but not taking due to non-compliance.  Pt presents responsive, screaming out.  Hx alzheimer's.  MD present at bedside.

## 2016-04-25 NOTE — ED Notes (Signed)
Code  Sepsis  Called  To  Doug  At  [facility] 

## 2016-04-25 NOTE — Consult Note (Addendum)
Patient Seen and examined.  82 yo white female with acute seizures from NH with LOW BP, patient was given 4L IVF's and BP improved Patient obtunded, daughter at bedside   PHYSICAL EXAMINATION:  GENERAL:critically ill appearing,malnourished looking HEAD: Normocephalic, atraumatic.  EYES: Pupils equal, round, reactive to light.  No scleral icterus.  MOUTH: dry mucosal membrane. NECK: Supple. No thyromegaly. No nodules. No JVD. c collar in place PULMONARY:  +rhonchi CARDIOVASCULAR: S1 and S2. Regular rate and rhythm. No murmurs, rubs, or gallops.  GASTROINTESTINAL: Soft, nontender, +distended. No masses. Positive bowel sounds. No hepatosplenomegaly.  MUSCULOSKELETAL: No swelling, clubbing, or edema.  NEUROLOGIC: GCS<8T SKIN:intact,warm,dry   Patient breathing comfortably I have discussed with daughter regarding poor prognosis and cognitive and physical decline of patient Daughter understands that this could be process of dying  I have recommended to admit for floor by Hospitalist, do not place CVL and do not use vasopressors, daughter is in agreement  Patient is DNR/DNI  Recommend Palliative care consult and hospice referral, likely to be made comfort care in 24 hrs Case discussed with Dr. Hubbard RobinsonSainani    Heather Kirk Heather Kirk, M.D.  [facility]  Medical Director [facility] Medical Director [facility] Cardio[facility] Department

## 2016-04-26 ENCOUNTER — Telehealth: Payer: Self-pay | Admitting: Internal Medicine

## 2016-04-26 LAB — URINE CULTURE: Culture: NO GROWTH

## 2016-04-26 MED ORDER — LORAZEPAM 1 MG PO TABS: 1.0000 mg | ORAL_TABLET | ORAL | 0 refills | Status: AC | PRN

## 2016-04-26 MED ORDER — MORPHINE SULFATE 20 MG/5ML PO SOLN: 5.0000 mg | ORAL | 0 refills | Status: AC | PRN

## 2016-04-26 NOTE — Consult Note (Signed)
Consultation Note Date: 04/26/2016   Patient Name: Heather Kirk  DOB: 02/22/1933  MRN: [id number]  Age / Sex: 82 y.o., female  PCP: Karie Schwalbe, MD Referring Physician: Auburn Bilberry, MD  Reason for Consultation: Establishing goals of care, Hospice Evaluation, Non pain symptom management, Pain control and Psychosocial/spiritual support  HPI/Patient Profile: 82 y.o. female with past medical history of dementia, HTN, asthma and GERD, admitted on 04/25/2016 with AMS and witnessed seizures, presumed sepsis with leukocytosis and hypotension. Patient found to have an occipital CVA on CT. Had seized once at her SNF and once in the ED, per daughter. Has not been taking good PO for last couple of weeks. She was seen in ED 9/23 and started on cipro for possible UTI and had not been taking her medication. However, urine culture resulted only with multiple species present. Repeat UA not suggestive of UTI. Patient's kidney function is worsening (SCr 2.15 and K 2.7) and nutritional status is poor (albumin 1.8). She is thought to be end-stage dementia, and patient's daughter has expressed desire for her mother to be comfortable to primary team. Primary team has initiated comfort care measures with ativan, haldol, and morphine ordered. Keppra is being administered for reported seizures. Palliative was consulted for GOC discussion.  Clinical Assessment and Goals of Care: Patient resting comfortably in bed. Spoke with patient's daughter Heather Kirk over the phone under supervision of NP Lorinda Creed. She stated understanding that her mother is having organ failure, that there do not appear to be any reversible disease processes, and that she is not expected to improve. Daughter says she knows her mother's wishes and that she would want to be made comfortable. Discussed role for hospice services. Daughter expressed  preference for residential hospice rather than receiving hospice services at [facility], where she has been for the past 6 years, as she would like her mother to have as close to 1-to-1 care as possible.    HCPOA: Heather Kirk, patient's only living child    SUMMARY OF RECOMMENDATIONS   Pt's daughter interested in pursuing hospice options. Pt is DNR and Comfort Care Only.   Code Status/Advance Care Planning:  DNR   Placed Referral to Social Work for residential hospice placement   Symptom Management:   Patient on IV keppra, otherwise would transition morphine and ativan to PO.  Palliative Prophylaxis:   Delirium Protocol, Frequent Pain Assessment, Oral Care and Turn Reposition  Additional Recommendations (Limitations, Scope, Preferences):  Avoid Hospitalization, Full Comfort Care, Initiate Comfort Feeding and No IV Fluids  Psycho-social/Spiritual:   Additional Recommendations: Caregiving  Support/Resources  Prognosis:   < 2 weeks; end-stage dementia, no longer taking PO, with significant lab abnormalities (albumin 1.8, SCr 2.15, K 2.7, LA 3.0) that are only expected to worsen; with recent stroke and new onset seizures  Discharge Planning: Hospice facility      Primary Diagnoses: Present on Admission: . Altered mental status  I have reviewed the medical record, interviewed the patient and family, and examined the  patient. The following aspects are pertinent.  Past Medical History:  Diagnosis Date  . Alzheimer's dementia   . Anxiety disorder   . Hypertension   . Iron deficiency anemia    Social History   Social History  . Marital status: Widowed    Spouse name: N/A  . Number of children: N/A  . Years of education: N/A   Social History Main Topics  . Smoking status: Never Smoker  . Smokeless tobacco: Never Used  . Alcohol use No  . Drug use: No  . Sexual activity: No   Other Topics Concern  . None   Social History Narrative  . None     Family History  Problem Relation Age of Onset  . Diabetes Neg Hx   . CVA Neg Hx   . Hypertension Neg Hx    Scheduled Meds:  Continuous Infusions:  PRN Meds:.acetaminophen **OR** acetaminophen, glycopyrrolate **OR** glycopyrrolate **OR** glycopyrrolate, LORazepam **OR** LORazepam **OR** LORazepam, morphine injection, ondansetron **OR** ondansetron (ZOFRAN) IV Medications Prior to Admission:  Prior to Admission medications   Medication Sig Start Date End Date Taking? Authorizing Provider  acetaminophen (TYLENOL) 650 MG CR tablet Take 650 mg by mouth See admin instructions. Take 650 mg by mouth 3 times a day at 8 am, 2 pm, 8 pm.-Scheduled Take 650 mg by mouth every 4 hours as needed for pain- PRN   Yes Historical Provider, MD  aspirin 81 MG chewable tablet Chew 81 mg by mouth daily.   Yes Historical Provider, MD  cetirizine (ZYRTEC) 10 MG tablet Take 10 mg by mouth at bedtime.    Yes Historical Provider, MD  ciprofloxacin (CIPRO) 500 MG tablet Take 500 mg by mouth 2 (two) times daily. 04/24/16 04/29/16 Yes Historical Provider, MD  citalopram (CELEXA) 20 MG tablet Take 20 mg by mouth at bedtime.    Yes Historical Provider, MD  donepezil (ARICEPT) 10 MG tablet Take 10 mg by mouth at bedtime.   Yes Historical Provider, MD  ergocalciferol (VITAMIN D2) 50000 units capsule Take 50,000 Units by mouth every 30 (thirty) days. *Take on the 24th*   Yes Historical Provider, MD  metroNIDAZOLE (FLAGYL) 500 MG tablet Take 1 tablet (500 mg total) by mouth 3 (three) times daily. 04/22/16 05/06/16 Yes Nita Sicklearolina Veronese, MD  QUEtiapine (SEROQUEL) 25 MG tablet Take 25 mg by mouth 2 (two) times daily.   Yes Historical Provider, MD  traMADol (ULTRAM) 50 MG tablet Take 50 mg by mouth every 8 (eight) hours as needed.   Yes Historical Provider, MD  ciprofloxacin (CIPRO) 500 MG tablet Take 1 tablet (500 mg total) by mouth 2 (two) times daily. 04/22/16 05/02/16  Nita Sicklearolina Veronese, MD   No Known Allergies Review of  Systems Negative for cough, blood in stool, v/d  Physical Exam General: Thin woman, resting in bed Respiratory: No increased WOB  Vital Signs: BP 122/70 (BP Location: Right Arm)   Pulse 93   Temp 98 F (36.7 C) (Oral)   Resp 15   Ht 5\' 4"  (1.626 m)   Wt 62.4 kg (137 lb 9.6 oz)   SpO2 97%   BMI 23.62 kg/m  Pain Assessment: 0-10     SpO2: SpO2: 97 % O2 Device:SpO2: 97 % O2 Flow Rate: .O2 Flow Rate (L/min): 2 L/min  IO: Intake/output summary:   Intake/Output Summary (Last 24 hours) at 04/26/16 1449 Last data filed at 04/25/16 1504  Gross per 24 hour  Intake  50 ml  Output                0 ml  Net               50 ml    LBM: Last BM Date:  (unknown) Baseline Weight: Weight: 63.5 kg (140 lb) Most recent weight: Weight: 62.4 kg (137 lb 9.6 oz)     Palliative Assessment/Data:   Flowsheet Rows   Flowsheet Row Most Recent Value  Intake Tab  Referral Department  Hospitalist  Unit at Time of Referral  Med/Surg Unit  Palliative Care Primary Diagnosis  Other (Comment)  Date Notified  04/25/16  Palliative Care Type  New Palliative care  Reason for referral  Clarify Goals of Care, Counsel Regarding Hospice, Pain, Non-pain Symptom  Date of Admission  04/25/16  Date first seen by Palliative Care  04/26/16  # of days Palliative referral response time  1 Day(s)  # of days IP prior to Palliative referral  0  Clinical Assessment  Palliative Performance Scale Score  20%  Psychosocial & Spiritual Assessment  Palliative Care Outcomes  Patient/Family meeting held?  Yes  Who was at the meeting?  Spoke with patient's daughter on the phone.   Palliative Care Outcomes  Clarified goals of care, Transitioned to hospice  Patient/Family wishes: Interventions discontinued/not started   Antibiotics, Tube feedings/TPN  Actual Discharge Date  04/26/16     Time In: 11:00 Time Out: 11:30 Time Total: 30 minutes Greater than 50%  of this time was spent counseling and  coordinating care related to the above assessment and plan.  Signed by: Jamelle Haring, MD [facility], PGY-2 With NP Lorinda Creed   Please contact Palliative Medicine Team phone at [phone] for questions and concerns.  For individual provider: See Loretha Stapler

## 2016-04-26 NOTE — Progress Notes (Signed)
Pt picked up by ems. IV x3 removed. All belongings packed and taken by family to facility.

## 2016-04-26 NOTE — Care Management (Signed)
Admitted to [facility] with the diagnosis of altered mental status. A resident of [facility] since 10[facility]17/15. Daughter is Wilkie AyeKristy [phone]). Unable to communicate. Palliative Care referral pending. Poor prognosis. Comfort measures? Gwenette GreetBrenda S Silvio Sausedo RN MSN CCM Care Management [phone]

## 2016-04-26 NOTE — Discharge Instructions (Signed)
°  DIET:  As tolerated  DISCHARGE CONDITION:  Stable  ACTIVITY:  Activity as tolerated  OXYGEN:  Home Oxygen: No.   Oxygen Delivery: room air  DISCHARGE LOCATION:  Hospices     ADDITIONAL DISCHARGE INSTRUCTION:   If you experience worsening of your admission symptoms, develop shortness of breath, life threatening emergency, suicidal or homicidal thoughts you must seek medical attention immediately by calling 911 or calling your MD immediately  if symptoms less severe.  You Must read complete instructions/literature along with all the possible adverse reactions/side effects for all the Medicines you take and that have been prescribed to you. Take any new Medicines after you have completely understood and accpet all the possible adverse reactions/side effects.   Please note  You were cared for by a hospitalist during your hospital stay. If you have any questions about your discharge medications or the care you received while you were in the hospital after you are discharged, you can call the unit and asked to speak with the hospitalist on call if the hospitalist that took care of you is not available. Once you are discharged, your primary care physician will handle any further medical issues. Please note that NO REFILLS for any discharge medications will be authorized once you are discharged, as it is imperative that you return to your primary care physician (or establish a relationship with a primary care physician if you do not have one) for your aftercare needs so that they can reassess your need for medications and monitor your lab values.

## 2016-04-26 NOTE — Care Management (Signed)
Admitted to [facility] with the diagnosis of altered mental status. A resident of [facility] since 10/17/15. Daughter is Kristy ([phone]). Unable to communicate. Poor prognosis. Palliative Care consult pending. [name] S Harvey Lingo RN MSN CCM Care Management [phone] 

## 2016-04-26 NOTE — Telephone Encounter (Signed)
I have heard [facility] Message left for daughter

## 2016-04-26 NOTE — Clinical Social Work Note (Signed)
Clinical Social Work Assessment  Patient Details  Name: Heather Kirk MRN: [id number] Date of Birth: 04/02/1933  Date of referral:  04/26/16               Reason for consult:  Discharge Planning                Permission sought to share information with:  Family Supports Permission granted to share information::     Name::        Agency::     Relationship::   (Heather Kirk- Daughter)  SolicitorContact Information:     Housing/Transportation Living arrangements for the past 2 months:  Skilled Nursing Facility ([facility]) Source of Information:  Adult Children Patient Interpreter Needed:  None Criminal Activity/Legal Involvement Pertinent to Current Situation/Hospitalization:  No - Comment as needed Significant Relationships:  Adult Children, Other Family Members Lives with:  Facility Resident [name] Do you feel safe going back to the place where you live?  No Need for family participation in patient care:  Yes (Comment) (Heather Kirk- Daughter)  Care giving concerns:  [facility] is [facility]    Social Worker assessment / plan:  CSW spoke to patient's daughter Heather Kirk over the phone. Per Heather Caneshristine she's interested in patient going to [facility] instead of returning [facility] with [facility]ospice. Granted CSW verbal permission to coordinate discharge with [name] and to send a referral. Granted CSW verbal permission to inform [facility] of above. Referral given to Heather BraunKaren - [facility]. Contacted Heather Kirk- Admissions at [facility] and informed her of above. Informed MD. Completed discharge packet and placed it on patient's chart.   Employment status:  Retired Health and safety inspectornsurance information:  Medicare PT Recommendations:  Not assessed at this time Information / Referral to community resources:   Warehouse manager(Residential Hospice Facility)  Patient/Family's Response to care:  Patient's daughter in agreement to discharge to [name] today 04/26/17  Patient/Family's Understanding of and Emotional Response to Diagnosis, Current Treatment, and Prognosis:  Daughter reports that she understands and thanked CSW for assistance.   Emotional Assessment Appearance:  Appears stated age Attitude/Demeanor/Rapport:  Unable to Assess Affect (typically observed):  Unable to Assess Orientation:   (Unable to Assess) Alcohol / Substance use:  Not Applicable Psych involvement (Current and /or in the community):  No (Comment)  Discharge Needs  Concerns to be addressed:  Discharge Planning Concerns Readmission within the last 30 days:  No Current discharge risk:  Chronically ill Barriers to Discharge:  Continued Medical Work up   [name], LCSW 04/26/2016, 2:03 PM

## 2016-04-26 NOTE — Progress Notes (Signed)
[facility] [place] [facility]                                                                                                                                                                                            Patient Demographics   Heather Kirk, is a 82 y.o. female, DOB - 08/16/1932, WUJ:[id number]  Admit date - 04/25/2016   Admitting Physician Houston Siren, MD  Outpatient Primary MD for the patient is [name], MD   LOS - 1  Subjective: Patient admitted with altered mental status admitted for purposes of comfort care    Review of Systems:   CONSTITUTIONAL: Unable to provide due to patient being comfort care  Vitals:   Vitals:   04/25/16 1838 04/25/16 2030 04/26/16 0541 04/26/16 0855  BP: 105/70  (!) 99/55 122/70  Pulse: (!) 104  96 93  Resp: 20  15 15   Temp: 97.9 F (36.6 C)  100 F (37.8 C) 98 F (36.7 C)  TempSrc: Oral  Oral Oral  SpO2: 94%  97% 97%  Weight:  137 lb 9.6 oz (62.4 kg)    Height:  5\' 4"  (1.626 m)      Wt Readings from Last 3 Encounters:  04/25/16 137 lb 9.6 oz (62.4 kg)  04/22/16 140 lb (63.5 kg)     Intake/Output Summary (Last 24 hours) at 04/26/16 1114 Last data filed at 04/25/16 1504  Gross per 24 hour  Intake               50 ml  Output                0 ml  Net               50 ml    Physical Exam:   GENERAL:Comfortable currently not responsive HEAD, EYES, EARS, NOSE AND THROAT: Atraumatic, normocephalic. . Pupils equal and reactive to light. Sclerae anicteric. No conjunctival injection. No oro-pharyngeal erythema.  NECK: Supple. There is no jugular venous distention. No bruits, no lymphadenopathy, no thyromegaly.  HEART: Regular rate and rhythm,. No murmurs, no rubs, no clicks.  LUNGS: Clear to auscultation bilaterally. No rales or rhonchi. No wheezes.  ABDOMEN: Soft, flat, nontender, nondistended. Has good bowel sounds. No hepatosplenomegaly appreciated.  EXTREMITIES: No evidence of  any cyanosis, clubbing, or peripheral edema.  +2 pedal and radial pulses bilaterally.  NEUROLOGIC: Patient comfortable.  SKIN: Moist and warm with no rashes appreciated.  Psych: Not anxious, depressed LN: No inguinal LN enlargement    Antibiotics   Anti-infectives    Start     Dose/Rate Route  Frequency Ordered Stop   04/25/16 1600  acyclovir (ZOVIRAX) 635 mg in dextrose 5 % 100 mL IVPB     10 mg/kg  63.5 kg 112.7 mL/hr over 60 Minutes Intravenous  Once 04/25/16 1507 04/25/16 1738   04/25/16 1530  ceFEPIme (MAXIPIME) 2 g in dextrose 5 % 50 mL IVPB     2 g 100 mL/hr over 30 Minutes Intravenous  Once 04/25/16 1506 04/25/16 1637   04/25/16 1515  vancomycin (VANCOCIN) IVPB 1000 mg/200 mL premix     1,000 mg 200 mL/hr over 60 Minutes Intravenous  Once 04/25/16 1506 04/25/16 1638   04/25/16 1430  cefTRIAXone (ROCEPHIN) 2 g in dextrose 5 % 50 mL IVPB     2 g 100 mL/hr over 30 Minutes Intravenous  Once 04/25/16 1407 04/25/16 1504   04/25/16 1411  dextrose 5 % with cefTRIAXone (ROCEPHIN) ADS Med    Comments:  newsholme, katie: cabinet override      04/25/16 1411 04/25/16 1837      Medications   Scheduled Meds:  Continuous Infusions:  PRN Meds:.acetaminophen **OR** acetaminophen, glycopyrrolate **OR** glycopyrrolate **OR** glycopyrrolate, LORazepam **OR** LORazepam **OR** LORazepam, morphine injection, ondansetron **OR** ondansetron (ZOFRAN) IV   Data Review:   Micro Results Recent Results (from the past 240 hour(s))  Urine culture     Status: Abnormal   Collection Time: 04/22/16  4:36 PM  Result Value Ref Range Status   Specimen Description URINE, RANDOM  Final   Special Requests NONE  Final   Culture MULTIPLE SPECIES PRESENT, SUGGEST RECOLLECTION (A)  Final   Report Status 04/24/2016 FINAL  Final  Blood Culture (routine x 2)     Status: None (Preliminary result)   Collection Time: 04/25/16  2:07 PM  Result Value Ref Range Status   Specimen Description BLOOD RIGHT FOREARM   Final   Special Requests BOTTLES DRAWN AEROBIC AND ANAEROBIC  3CC  Final   Culture NO GROWTH < 24 HOURS  Final   Report Status PENDING  Incomplete  Blood Culture (routine x 2)     Status: None (Preliminary result)   Collection Time: 04/25/16  2:07 PM  Result Value Ref Range Status   Specimen Description BLOOD LEFT WRIST  Final   Special Requests BOTTLES DRAWN AEROBIC AND ANAEROBIC  1CC  Final   Culture NO GROWTH < 24 HOURS  Final   Report Status PENDING  Incomplete    Radiology Reports Ct Head Wo Contrast  Result Date: 04/25/2016 CLINICAL DATA:  Seizure with altered mental status EXAM: CT HEAD WITHOUT CONTRAST TECHNIQUE: Contiguous axial images were obtained from the base of the skull through the vertex without intravenous contrast. COMPARISON:  April 19, 2012 FINDINGS: Brain: Moderate diffuse atrophy is again noted. There is no intracranial mass, hemorrhage, extra-axial fluid collection, or midline shift. There is evidence of a prior infarct in the medial right occipital lobe. There is mild ex vacuo phenomenon involving the atrium of the right lateral ventricle in this area. There is extensive small vessel disease throughout the centra semiovale bilaterally. There is evidence of an infarct in the upper mid right occipital lobe which appears recent and may well be acute. No other acute appearing infarct evident. Vascular: There is no hyperdense vessel evident. There is no appreciable vascular calcification. Skull: The bony calvarium appears intact. Sinuses/Orbits: There is mild mucosal thickening in several ethmoid air cells bilaterally. Other paranasal sinuses are clear. There is rightward deviation of the mid nasal septum. Orbits appear symmetric bilaterally. Other: Visualized mastoid  air cells are clear. IMPRESSION: Suspect recent/acute infarct in the upper mid right occipital lobe. There is a prior infarct in the medial right occipital lobe with nearby right lateral ventricular  encephalomalacia. There is elsewhere generalized atrophy with extensive periventricular small vessel disease. There is no mass, hemorrhage, or extra-axial fluid collection. There is mild mucosal thickening in several ethmoid air cells bilaterally. Electronically Signed   By: Bretta Bang III M.D.   On: 04/25/2016 16:16   Dg Chest Port 1 View  Result Date: 04/25/2016 CLINICAL DATA:  Altered mental status EXAM: PORTABLE CHEST 1 VIEW COMPARISON:  04/22/2016 chest radiograph. FINDINGS: Stable cardiomediastinal silhouette with mild cardiomegaly, aortic atherosclerosis and large hiatal hernia. No pneumothorax. No pleural effusion. No pulmonary edema. Stable mild compressive atelectasis in the medial lower lobes. Stable blunting of the left costophrenic angle, consistent with pleural-parenchymal scarring as seen on recent CT study. No acute consolidative airspace disease. IMPRESSION: Stable chest radiograph with mild cardiomegaly without pulmonary edema, large hiatal hernia with mild bibasilar atelectasis and aortic atherosclerosis. Electronically Signed   By: Delbert Phenix M.D.   On: 04/25/2016 14:57   Dg Chest Portable 1 View  Result Date: 04/22/2016 CLINICAL DATA:  Weakness, lethargy EXAM: PORTABLE CHEST 1 VIEW COMPARISON:  03/07/2014 chest radiograph. FINDINGS: Stable cardiomediastinal silhouette with normal heart size, moderate to large hiatal hernia and aortic atherosclerosis. No pneumothorax. Possible small left pleural effusion. No right pleural effusion. No pulmonary edema. Stable mild bibasilar scarring versus atelectasis. IMPRESSION: 1. Stable mild bibasilar scarring versus atelectasis. 2. Possible small left pleural effusion. 3. Stable moderate to large hiatal hernia. 4. Aortic atherosclerosis. Electronically Signed   By: Delbert Phenix M.D.   On: 04/22/2016 17:34   Ct Renal Stone Study  Result Date: 04/22/2016 CLINICAL DATA:  Acute onset of lethargy.  Initial encounter. EXAM: CT ABDOMEN AND  PELVIS WITHOUT CONTRAST TECHNIQUE: Multidetector CT imaging of the abdomen and pelvis was performed following the standard protocol without IV contrast. COMPARISON:  Right hip CT performed 01/24/2011 FINDINGS: Lower chest: Mild left basilar atelectasis or scarring is noted. A large hiatal hernia is noted, containing much of the stomach. Hepatobiliary: The liver is unremarkable in appearance. The patient is status post cholecystectomy, with clips noted at the gallbladder fossa. The common bile duct remains normal in caliber. Pancreas: The pancreas is within normal limits. Spleen: The spleen is unremarkable in appearance. Adrenals/Urinary Tract: The adrenal glands are unremarkable in appearance. Scattered bilateral renal cysts are noted, both hypodense and hyperdense in appearance. Mild bilateral renal atrophy is noted. There is no evidence of hydronephrosis. No renal or ureteral stones are identified. No perinephric stranding is seen. Stomach/Bowel: The stomach is unremarkable in appearance. The small bowel is within normal limits. The appendix is not visualized; there is no evidence for appendicitis. The colon is unremarkable in appearance. Wall thickening at the rectum raises concern for proctitis. An underlying mass cannot be entirely excluded. Vascular/Lymphatic: Scattered calcification is seen along the abdominal aorta and its branches. The abdominal aorta is otherwise grossly unremarkable. The inferior vena cava is grossly unremarkable. No retroperitoneal lymphadenopathy is seen. No pelvic sidewall lymphadenopathy is identified. Reproductive: The bladder is decompressed and not well assessed. The patient is status post hysterectomy. No suspicious adnexal masses are seen. Other: No additional soft tissue abnormalities are seen. Musculoskeletal: No acute osseous abnormalities are identified. There is mild chronic loss of height at vertebral body L1. There is mild grade 1 anterolisthesis of L4 on L5, reflecting  underlying facet disease.  Pins are noted at the proximal left femur. The visualized musculature is unremarkable in appearance. IMPRESSION: 1. Wall thickening at the rectum raises concern for proctitis. An underlying mass cannot be entirely excluded. Sigmoidoscopy could be considered for further evaluation, as deemed clinically appropriate. 2. Large hiatal hernia, containing much of the stomach. 3. Mild left basilar atelectasis or scarring noted. 4. Scattered aortic atherosclerosis. 5. Scattered bilateral renal cysts, both hypodense and hyperdense in nature. Mild bilateral renal atrophy noted. 6. Mild chronic loss of height at vertebral body L1. Electronically Signed   By: Roanna RaiderJeffery  Chang M.D.   On: 04/22/2016 19:55     CBC  Recent Labs Lab 04/22/16 1636 04/25/16 1407  WBC 13.6* 17.8*  HGB 14.1 13.8  HCT 42.8 44.4  PLT 144* 134*  MCV 88.7 91.1  MCH 29.2 28.4  MCHC 33.0 31.1*  RDW 14.7* 15.9*  LYMPHSABS 1.6 4.0*  MONOABS 0.7 1.7*  EOSABS 0.0 0.0  BASOSABS 0.1 0.1    Chemistries   Recent Labs Lab 04/22/16 1636 04/25/16 1437  NA 140 145  K 4.6 2.7*  CL 107 117*  CO2 29 17*  GLUCOSE 118* 187*  BUN 43* 27*  CREATININE 1.86* 2.15*  CALCIUM 8.9 7.2*  AST  --  28  ALT  --  10*  ALKPHOS  --  68  BILITOT  --  0.1*   ------------------------------------------------------------------------------------------------------------------ estimated creatinine clearance is 17.7 mL/min (by C-G formula based on SCr of 2.15 mg/dL (H)). ------------------------------------------------------------------------------------------------------------------ No results for input(s): HGBA1C in the last 72 hours. ------------------------------------------------------------------------------------------------------------------ No results for input(s): CHOL, HDL, LDLCALC, TRIG, CHOLHDL, LDLDIRECT in the last 72  hours. ------------------------------------------------------------------------------------------------------------------ No results for input(s): TSH, T4TOTAL, T3FREE, THYROIDAB in the last 72 hours.  Invalid input(s): FREET3 ------------------------------------------------------------------------------------------------------------------ No results for input(s): VITAMINB12, FOLATE, FERRITIN, TIBC, IRON, RETICCTPCT in the last 72 hours.  Coagulation profile  Recent Labs Lab 04/25/16 1437  INR 1.36    No results for input(s): DDIMER in the last 72 hours.  Cardiac Enzymes  Recent Labs Lab 04/22/16 1636 04/22/16 1938 04/25/16 1437  TROPONINI 0.03* 0.03* 0.51*   ------------------------------------------------------------------------------------------------------------------ Invalid input(s): POCBNP    Assessment & Plan   82 year old female with past medical history of advanced dementia, hypertension, Anxiety, iron deficiency anemia presents to the hospital due to altered mental status.  1. Altered mental status 2. Acute/subacute CVA 3. Advanced dementia 4. Acute Kidney Injury 5. Adult Failure to Thrive 6. Hypokalemia 7. Hypotension/sepsis 8. Recent UTI.   Continue comfort care measures as above, palliative care discussing with family regarding possible returning back to the facility with hospice following    Code Status Orders        Start     Ordered   04/25/16 1724  Do not attempt resuscitation (DNR)  Continuous    Question Answer Comment  In the event of cardiac or respiratory ARREST Do not call a "code blue"   In the event of cardiac or respiratory ARREST Do not perform Intubation, CPR, defibrillation or ACLS   In the event of cardiac or respiratory ARREST Use medication by any route, position, wound care, and other measures to relive pain and suffering. May use oxygen, suction and manual treatment of airway obstruction as needed for comfort.       04/25/16 1724    Code Status History    Date Active Date Inactive Code Status Order ID Comments User Context   This patient has a current code status but no historical code status.  Advance Directive Documentation   Flowsheet Row Most Recent Value  Type of Advance Directive  Out of facility DNR (pink MOST or yellow form)  Pre-existing out of facility DNR order (yellow form or pink MOST form)  No data  "MOST" Form in Place?  No data              DVT Prophylaxis Comfort care  Lab Results  Component Value Date   PLT 134 (L) 04/25/2016     Time Spent in minutes    Greater than 50% of time spent in care coordination and counseling patient regarding the condition and plan of care.   Auburn Bilberry M.D on 04/26/2016 at 11:14 AM  Between 7am to 6pm - Pager - [phone]  After 6pm go to [email or web address] - password [email or web address] [facility]  [facility]   Office  [phone]

## 2016-04-26 NOTE — Progress Notes (Signed)
Nutrition Brief Note Chart reviewed secondary to Malnutrition Screening Tool of 2. Pt now transitioning to comfort care.  No further nutrition interventions warranted at this time.  Please re-consult as needed.   Darryle Dennie B. Freida BusmanAllen, RD, LDN [phone] (pager) Weekend/On-Call pager [phone])

## 2016-04-26 NOTE — Progress Notes (Signed)
New hospice home referral received from CSW Christina Sunkins. Ms. Perkin is an 83 year old woman admitted to [facility] on 9/26 following a change in mental status. On arrival she was hypotensive and received fluid resuscitation.  CT  revealed a suspected acute infarct in the upper mid right occipital lobe. Also while in the ED patient had a witnessed  Seizure, she was treated with IV lorazepam as well as IV Keppra. Of note she had previously been at [facility] for treatment of  UTI, and had been discharged on oral abt therapy, she did not complete the course d/t her refusal to take the medication. She remains with altered mental status, unable to speak and has contractures. Her dauhtgter chose to make her comfortable and wishes to have her transfer to the hospice home. She is currently requiring IV morphine 1 mg for pain with 4 doses given in the last 12 hours as well as 2 doses of IV lorazepam.  °Writer met in the patient's room with her daughter Christina to initiate education regarding hospice services, philosophy and team approach to care with good understanding voiced. Questions answered, consents signed. Patient information faxed to hospice referral. Hospital care team and family all aware of and in agreement with plan for discharge today via EMS to the hospice home. Signed DNR portable form in place in discharge packet. Report called to the Hospice home. EMS to be notified for transport.Thank you for the opportunity to be involved in the care of this patient and her family. °Karen Robertson RN, BSN, CHPN °[facility], hospital Liaison °[phone] c °

## 2016-04-26 NOTE — Care Management Important Message (Signed)
Important Message  Patient Details  Name: Heather MatinShelby C Kirk MRN: [id number] Date of Birth: August 31, 1932   Medicare Important Message Given:  Yes    Gwenette GreetBrenda S Harvard Zeiss, RN 04/26/2016, 8:06 AM

## 2016-04-26 NOTE — Discharge Summary (Signed)
Heather Kirk, 82 y.o., DOB 06-19-1933, MRN [id number]. Admission date: 04/25/2016 Discharge Date 04/26/2016 Primary MD [name], MD Admitting Physician Houston Siren, MD  Admission Diagnosis  Other specified hypotension [I95.89] Delirium [R41.0] Seizure (HCC) [R56.9] Sepsis, due to unspecified organism [facility]) [A41.9]  Discharge Diagnosis   Active Problems:   Altered mental status  Acute stroke Acute encephalopathy Acute kidney injury Advanced dementia Failure to thrive Sepsis related to recurrent UTI      Hospital Course  Patient is a 82 year old female was brought to the hospital with altered mental status. Patient has progressive decline over the past few weeks and was noted to have acute CVA, acute kidney injury and hypotension and sepsis. Patient was seen in the emergency room and admitting physician discussed the case with the daughter and decided to not pursue aggressive therapy and was made comfort care. She was seen in consultation by palliative care today and decided to be transferred to the hospice home.           Consults  patative care team  Significant Tests:  See full reports for all details     Ct Head Wo Contrast  Result Date: 04/25/2016 CLINICAL DATA:  Seizure with altered mental status EXAM: CT HEAD WITHOUT CONTRAST TECHNIQUE: Contiguous axial images were obtained from the base of the skull through the vertex without intravenous contrast. COMPARISON:  April 19, 2012 FINDINGS: Brain: Moderate diffuse atrophy is again noted. There is no intracranial mass, hemorrhage, extra-axial fluid collection, or midline shift. There is evidence of a prior infarct in the medial right occipital lobe. There is mild ex vacuo phenomenon involving the atrium of the right lateral ventricle in this area. There is extensive small vessel disease throughout the centra semiovale bilaterally. There is evidence of an infarct in the upper mid right occipital lobe which  appears recent and may well be acute. No other acute appearing infarct evident. Vascular: There is no hyperdense vessel evident. There is no appreciable vascular calcification. Skull: The bony calvarium appears intact. Sinuses/Orbits: There is mild mucosal thickening in several ethmoid air cells bilaterally. Other paranasal sinuses are clear. There is rightward deviation of the mid nasal septum. Orbits appear symmetric bilaterally. Other: Visualized mastoid air cells are clear. IMPRESSION: Suspect recent/acute infarct in the upper mid right occipital lobe. There is a prior infarct in the medial right occipital lobe with nearby right lateral ventricular encephalomalacia. There is elsewhere generalized atrophy with extensive periventricular small vessel disease. There is no mass, hemorrhage, or extra-axial fluid collection. There is mild mucosal thickening in several ethmoid air cells bilaterally. Electronically Signed   By: Bretta Bang III M.D.   On: 04/25/2016 16:16   Dg Chest Port 1 View  Result Date: 04/25/2016 CLINICAL DATA:  Altered mental status EXAM: PORTABLE CHEST 1 VIEW COMPARISON:  04/22/2016 chest radiograph. FINDINGS: Stable cardiomediastinal silhouette with mild cardiomegaly, aortic atherosclerosis and large hiatal hernia. No pneumothorax. No pleural effusion. No pulmonary edema. Stable mild compressive atelectasis in the medial lower lobes. Stable blunting of the left costophrenic angle, consistent with pleural-parenchymal scarring as seen on recent CT study. No acute consolidative airspace disease. IMPRESSION: Stable chest radiograph with mild cardiomegaly without pulmonary edema, large hiatal hernia with mild bibasilar atelectasis and aortic atherosclerosis. Electronically Signed   By: Delbert Phenix M.D.   On: 04/25/2016 14:57   Dg Chest Portable 1 View  Result Date: 04/22/2016 CLINICAL DATA:  Weakness, lethargy EXAM: PORTABLE CHEST 1 VIEW COMPARISON:  03/07/2014 chest radiograph.  FINDINGS:  Stable cardiomediastinal silhouette with normal heart size, moderate to large hiatal hernia and aortic atherosclerosis. No pneumothorax. Possible small left pleural effusion. No right pleural effusion. No pulmonary edema. Stable mild bibasilar scarring versus atelectasis. IMPRESSION: 1. Stable mild bibasilar scarring versus atelectasis. 2. Possible small left pleural effusion. 3. Stable moderate to large hiatal hernia. 4. Aortic atherosclerosis. Electronically Signed   By: Delbert Phenix M.D.   On: 04/22/2016 17:34   Ct Renal Stone Study  Result Date: 04/22/2016 CLINICAL DATA:  Acute onset of lethargy.  Initial encounter. EXAM: CT ABDOMEN AND PELVIS WITHOUT CONTRAST TECHNIQUE: Multidetector CT imaging of the abdomen and pelvis was performed following the standard protocol without IV contrast. COMPARISON:  Right hip CT performed 01/24/2011 FINDINGS: Lower chest: Mild left basilar atelectasis or scarring is noted. A large hiatal hernia is noted, containing much of the stomach. Hepatobiliary: The liver is unremarkable in appearance. The patient is status post cholecystectomy, with clips noted at the gallbladder fossa. The common bile duct remains normal in caliber. Pancreas: The pancreas is within normal limits. Spleen: The spleen is unremarkable in appearance. Adrenals/Urinary Tract: The adrenal glands are unremarkable in appearance. Scattered bilateral renal cysts are noted, both hypodense and hyperdense in appearance. Mild bilateral renal atrophy is noted. There is no evidence of hydronephrosis. No renal or ureteral stones are identified. No perinephric stranding is seen. Stomach/Bowel: The stomach is unremarkable in appearance. The small bowel is within normal limits. The appendix is not visualized; there is no evidence for appendicitis. The colon is unremarkable in appearance. Wall thickening at the rectum raises concern for proctitis. An underlying mass cannot be entirely excluded. Vascular/Lymphatic:  Scattered calcification is seen along the abdominal aorta and its branches. The abdominal aorta is otherwise grossly unremarkable. The inferior vena cava is grossly unremarkable. No retroperitoneal lymphadenopathy is seen. No pelvic sidewall lymphadenopathy is identified. Reproductive: The bladder is decompressed and not well assessed. The patient is status post hysterectomy. No suspicious adnexal masses are seen. Other: No additional soft tissue abnormalities are seen. Musculoskeletal: No acute osseous abnormalities are identified. There is mild chronic loss of height at vertebral body L1. There is mild grade 1 anterolisthesis of L4 on L5, reflecting underlying facet disease. Pins are noted at the proximal left femur. The visualized musculature is unremarkable in appearance. IMPRESSION: 1. Wall thickening at the rectum raises concern for proctitis. An underlying mass cannot be entirely excluded. Sigmoidoscopy could be considered for further evaluation, as deemed clinically appropriate. 2. Large hiatal hernia, containing much of the stomach. 3. Mild left basilar atelectasis or scarring noted. 4. Scattered aortic atherosclerosis. 5. Scattered bilateral renal cysts, both hypodense and hyperdense in nature. Mild bilateral renal atrophy noted. 6. Mild chronic loss of height at vertebral body L1. Electronically Signed   By: Roanna Raider M.D.   On: 04/22/2016 19:55       Today   Subjective:   Heather Kirk  unresponsive  Objective:   Blood pressure 122/70, pulse 93, temperature 98 F (36.7 C), temperature source Oral, resp. rate 15, height 5\' 4"  (1.626 m), weight 137 lb 9.6 oz (62.4 kg), SpO2 97 %.  .  Intake/Output Summary (Last 24 hours) at 04/26/16 1425 Last data filed at 04/25/16 1504  Gross per 24 hour  Intake               50 ml  Output                0 ml  Net  50 ml    Exam VITAL SIGNS: Blood pressure 122/70, pulse 93, temperature 98 F (36.7 C), temperature source Oral,  resp. rate 15, height 5\' 4"  (1.626 m), weight 137 lb 9.6 oz (62.4 kg), SpO2 97 %.  GENERAL:  82 y.o.-year-old patient lying in the bedComfortable  EYES: Pupils equal, round, reactive to light and accommodation. No scleral icterus. Extraocular muscles intact.  HEENT: Head atraumatic, normocephalic. Oropharynx and nasopharynx clear.  NECK:  Supple, no jugular venous distention. No thyroid enlargement, no tenderness.  LUNGS: Normal breath sounds bilaterally, no wheezing, rales,rhonchi or crepitation. No use of accessory muscles of respiration.  CARDIOVASCULAR: S1, S2 normal. No murmurs, rubs, or gallops.  ABDOMEN: Soft, nontender, nondistended. Bowel sounds present. No organomegaly or mass.  EXTREMITIES: No pedal edema, cyanosis, or clubbing.  NEUROLOGIC:Not respond  PSYCHIATRIC:Not responding  SKIN: No obvious rash, lesion, or ulcer.   Data Review     CBC w Diff: Lab Results  Component Value Date   WBC 17.8 (H) 04/25/2016   HGB 13.8 04/25/2016   HGB 12.1 03/10/2014   HCT 44.4 04/25/2016   HCT 34.7 (L) 03/09/2014   PLT 134 (L) 04/25/2016   PLT 149 (L) 03/09/2014   LYMPHOPCT 22 04/25/2016   LYMPHOPCT 16.6 03/09/2014   MONOPCT 10 04/25/2016   MONOPCT 10.8 03/09/2014   EOSPCT 0 04/25/2016   EOSPCT 5.4 03/09/2014   BASOPCT 1 04/25/2016   BASOPCT 0.9 03/09/2014   CMP: Lab Results  Component Value Date   NA 145 04/25/2016   NA 140 03/09/2014   K 2.7 (LL) 04/25/2016   K 4.1 03/09/2014   CL 117 (H) 04/25/2016   CL 108 (H) 03/09/2014   CO2 17 (L) 04/25/2016   CO2 23 03/09/2014   BUN 27 (H) 04/25/2016   BUN 23 (H) 03/09/2014   CREATININE 2.15 (H) 04/25/2016   CREATININE 1.77 (H) 03/09/2014   PROT 4.1 (L) 04/25/2016   ALBUMIN 1.8 (L) 04/25/2016   BILITOT 0.1 (L) 04/25/2016   ALKPHOS 68 04/25/2016   AST 28 04/25/2016   ALT 10 (L) 04/25/2016  .  Micro Results Recent Results (from the past 240 hour(s))  Urine culture     Status: Abnormal   Collection Time: 04/22/16  4:36  PM  Result Value Ref Range Status   Specimen Description URINE, RANDOM  Final   Special Requests NONE  Final   Culture MULTIPLE SPECIES PRESENT, SUGGEST RECOLLECTION (A)  Final   Report Status 04/24/2016 FINAL  Final  Blood Culture (routine x 2)     Status: None (Preliminary result)   Collection Time: 04/25/16  2:07 PM  Result Value Ref Range Status   Specimen Description BLOOD RIGHT FOREARM  Final   Special Requests BOTTLES DRAWN AEROBIC AND ANAEROBIC  3CC  Final   Culture NO GROWTH < 24 HOURS  Final   Report Status PENDING  Incomplete  Blood Culture (routine x 2)     Status: None (Preliminary result)   Collection Time: 04/25/16  2:07 PM  Result Value Ref Range Status   Specimen Description BLOOD LEFT WRIST  Final   Special Requests BOTTLES DRAWN AEROBIC AND ANAEROBIC  1CC  Final   Culture NO GROWTH < 24 HOURS  Final   Report Status PENDING  Incomplete        Code Status Orders        Start     Ordered   04/25/16 1724  Do not attempt resuscitation (DNR)  Continuous    Question  Answer Comment  In the event of cardiac or respiratory ARREST Do not call a "code blue"   In the event of cardiac or respiratory ARREST Do not perform Intubation, CPR, defibrillation or ACLS   In the event of cardiac or respiratory ARREST Use medication by any route, position, wound care, and other measures to relive pain and suffering. May use oxygen, suction and manual treatment of airway obstruction as needed for comfort.      04/25/16 1724    Code Status History    Date Active Date Inactive Code Status Order ID Comments User Context   This patient has a current code status but no historical code status.    Advance Directive Documentation   Flowsheet Row Most Recent Value  Type of Advance Directive  Out of facility DNR (pink MOST or yellow form)  Pre-existing out of facility DNR order (yellow form or pink MOST form)  No data  "MOST" Form in Place?  No data            Discharge  Medications     Medication List    STOP taking these medications   cetirizine 10 MG tablet Commonly known as:  ZYRTEC   ciprofloxacin 500 MG tablet Commonly known as:  CIPRO   citalopram 20 MG tablet Commonly known as:  CELEXA   donepezil 10 MG tablet Commonly known as:  ARICEPT   ergocalciferol 50000 units capsule Commonly known as:  VITAMIN D2   metroNIDAZOLE 500 MG tablet Commonly known as:  FLAGYL   QUEtiapine 25 MG tablet Commonly known as:  SEROQUEL   traMADol 50 MG tablet Commonly known as:  ULTRAM     TAKE these medications   acetaminophen 650 MG CR tablet Commonly known as:  TYLENOL Take 650 mg by mouth See admin instructions. Take 650 mg by mouth 3 times a day at 8 am, 2 pm, 8 pm.-Scheduled Take 650 mg by mouth every 4 hours as needed for pain- PRN   aspirin 81 MG chewable tablet Chew 81 mg by mouth daily.   LORazepam 1 MG tablet Commonly known as:  ATIVAN Take 1 tablet (1 mg total) by mouth every 4 (four) hours as needed for anxiety.   morphine 20 MG/5ML solution Take 1.3 mLs (5.2 mg total) by mouth every 2 (two) hours as needed for pain.          Total Time in preparing paper work, data evaluation and todays exam - 35 minutes  Auburn Bilberry M.D on 04/26/2016 at 2:25 [facility]  [facility]   Office  [phone]

## 2016-04-26 NOTE — Progress Notes (Signed)
EMS notified for transport at 4:10 pm. Staff RN Meghan aware. Dayna BarkerKaren Robertson RN, BSN, [facility], [facility]

## 2016-04-26 NOTE — Telephone Encounter (Signed)
Neysa BonitoChristy called to let you know ms [facility]on [facility].  They are telling christy pt is not going to make it and are talking about sending her to hospice.  She has had a stroke and 2 seizure yesterday.  Kidneys shutting.  Neysa BonitoChristy needs fmla paperwork filled out and will try to dropped it off today or tomorrow

## 2016-04-26 NOTE — Telephone Encounter (Signed)
Lorene DyChristie dropped off fmla paperwork IN Dr Karle StarchLetvak's IN BOX

## 2016-04-26 NOTE — Care Management (Signed)
[facility] with [facility] diagnosis [facility] altered mental status. A resident of [facility] since 10/17/15. Daughter is Wilkie AyeKristy [phone]). Unable to communicate. Poor prognosis. Palliative Care consult pending. Gwenette GreetBrenda S Oleg Oleson RN MSN CCM Care Management [phone]

## 2016-04-27 NOTE — Telephone Encounter (Signed)
Please fax the forms to her work. I saw her a few minutes ago and discussed this ([facility]

## 2016-04-27 NOTE — Telephone Encounter (Signed)
Paperwork faxed Christy aware  Copy for file Copy for scan Copy for pt

## 2016-04-30 LAB — CULTURE, BLOOD (ROUTINE X 2)
CULTURE: NO GROWTH
Culture: NO GROWTH

## 2016-05-05 NOTE — Telephone Encounter (Signed)
Pt called she is having problems with her fmla  I spoke with [email or web address]  Lorene DyChristie put in for intermittent leave  From 04/26/16 to 10/24/16  We put continuous leave From 04/25/16 to 05/09/16.  I spoke with Lorene DyChristie this morning She stated she was told by her HR to put in for intermittent leave.  She stated she doesn't really know how fmla works but she will be out with her mom until she passes and she wants to make sure she is covered with fmla.  She doesn't want to get points for being out.  Drinda Buttsnnette stated question 3 part A&B needed to be filled out initial and date.   Also question 1 needed to be mark through  Initial and date  I called back and spoke with Drinda Buttsnnette again to clarify what the paperwork needed to say.  She stated Lorene DyChristie is taking time of sporadically.  Example she was off Tuesday worked wed and [employer] off today   Paperwork in dr Alphonsus Siasletvak  IN BOX

## 2016-05-05 NOTE — Telephone Encounter (Signed)
Paperwork faxed Christie aware Copy for file Copy for christie Copy for scan

## 2016-05-05 NOTE — Telephone Encounter (Signed)
I changed it Hopefully that will work

## 2016-05-08 ENCOUNTER — Telehealth: Payer: Self-pay | Admitting: Internal Medicine

## 2016-05-08 NOTE — Telephone Encounter (Signed)
Hospice called to let Dr.Letvak know patient passed away on 05/21/2016 at 5:45 am at the Hospice home.

## 2016-05-09 NOTE — Telephone Encounter (Signed)
I saw that

## 2016-05-31 DEATH — deceased

## 2018-04-21 IMAGING — CT CT HEAD W/O CM
3 of 4 series · 15 of 47 positions shown, 18 images · non-contrast
Comparison: April 19, 2012

CLINICAL DATA: Seizure with altered mental status

EXAM:
CT HEAD WITHOUT CONTRAST
TECHNIQUE: Contiguous axial images were obtained from the base of the skull
through the vertex without intravenous contrast.

[Series 4: head wo · axial · 0.39mm/px · z∈[-54,+66]mm · 9 of 28 slices shown, 12 images]
[im 2/28  brain]
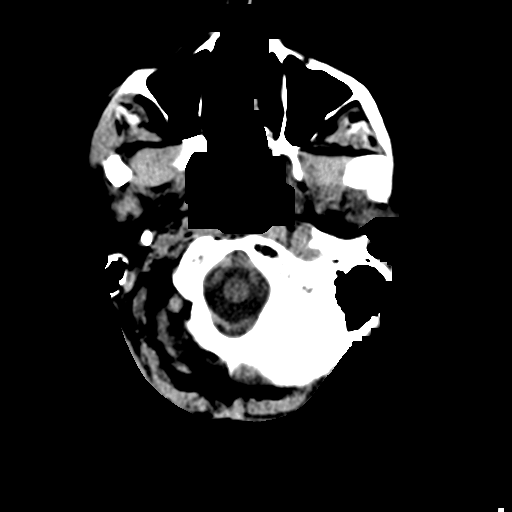
[im 2/28  bone]
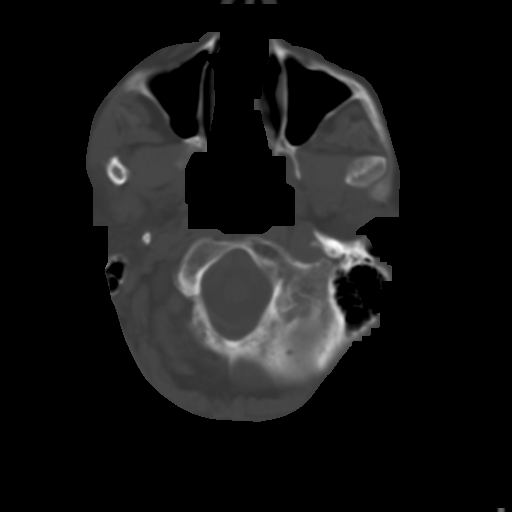
[im 6/28  brain]
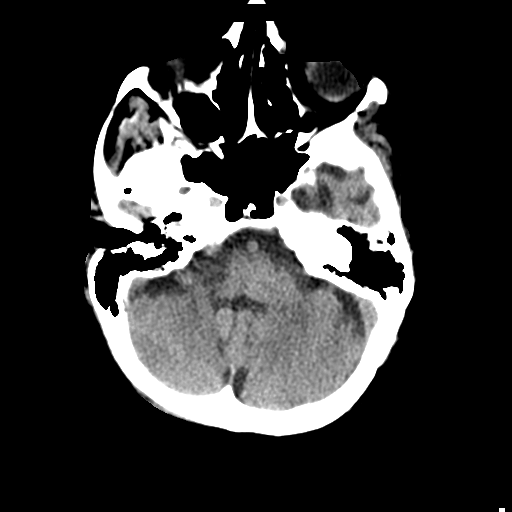
[im 8/28  brain]
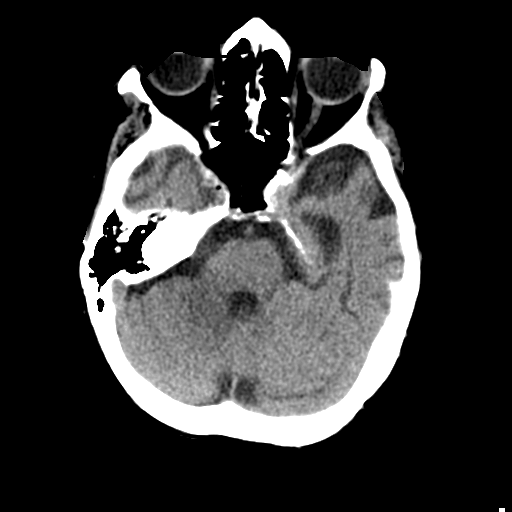
[im 11/28  brain]
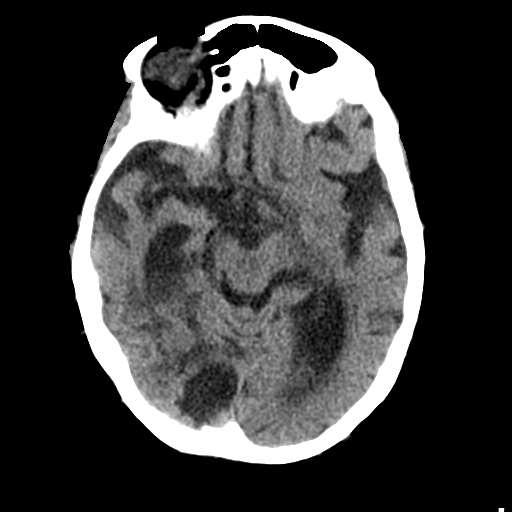
[im 15/28  brain]
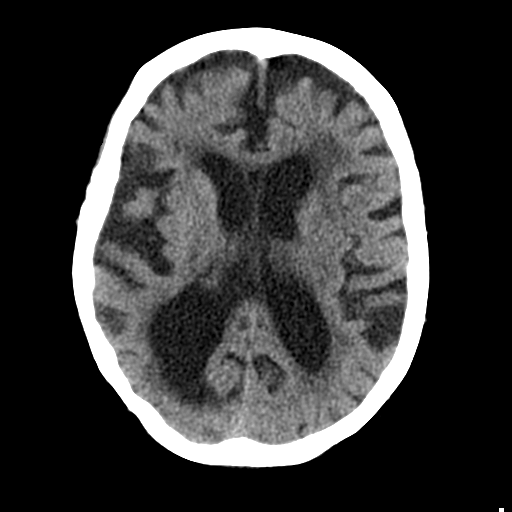
[im 15/28  bone]
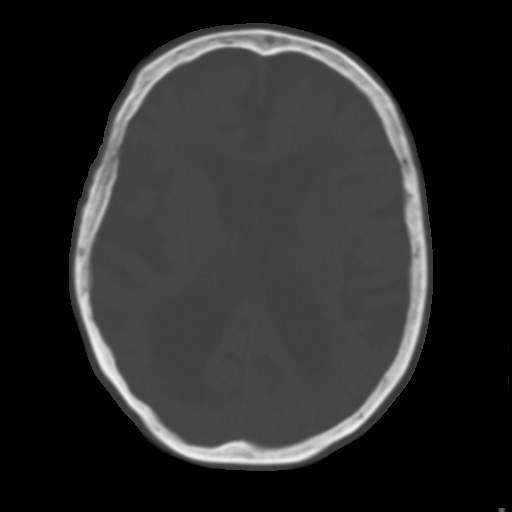
[im 17/28  brain]
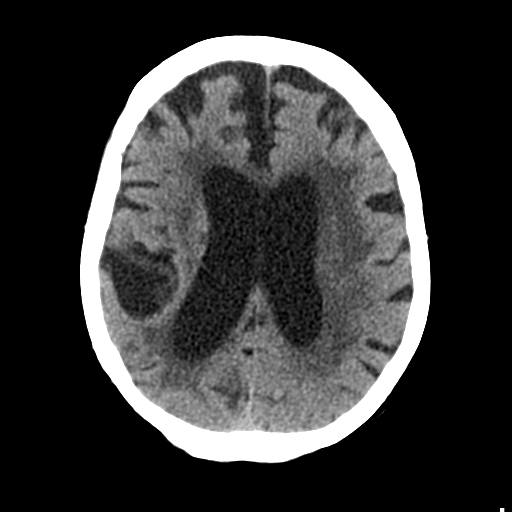
[im 20/28  brain]
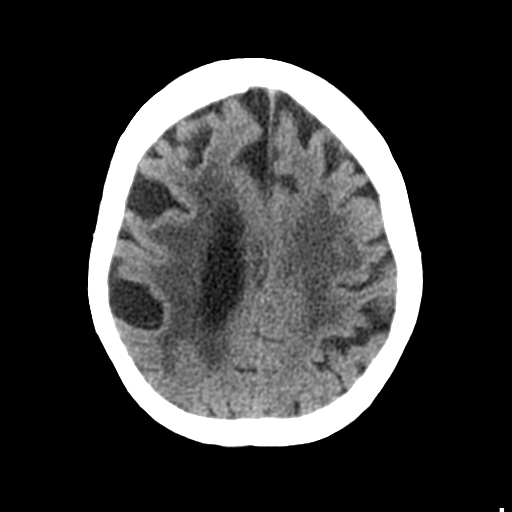
[im 22/28  brain]
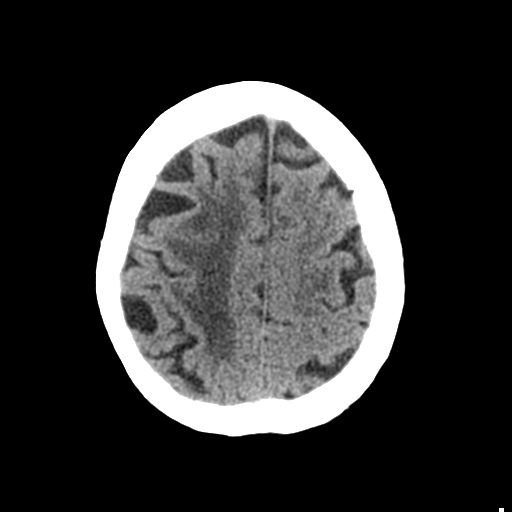
[im 26/28  brain]
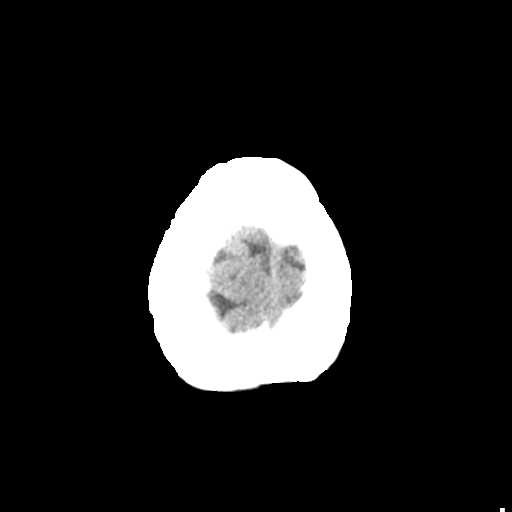
[im 26/28  bone]
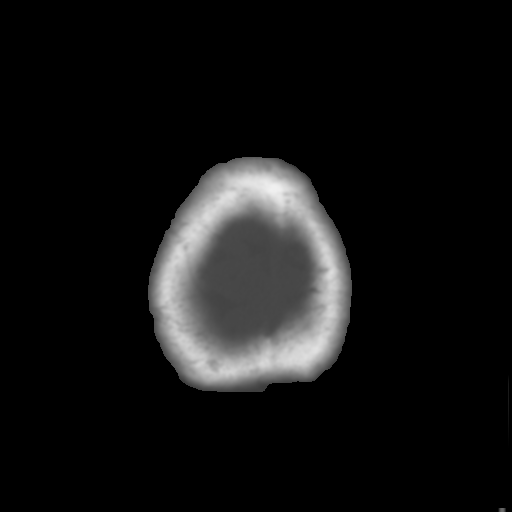

[Series 6: coronal soft tissue · coronal · 0.27mm/px · 3 of 59 slices shown]
[im 20/59  brain]
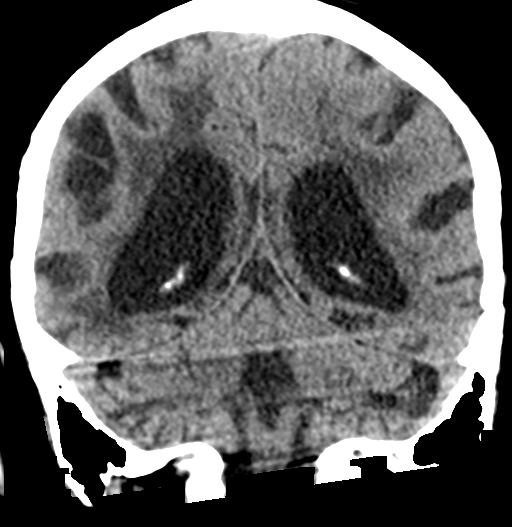
[im 26/59  brain]
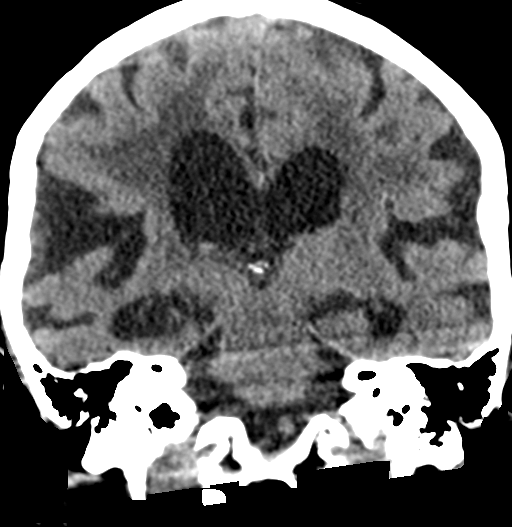
[im 33/59  brain]
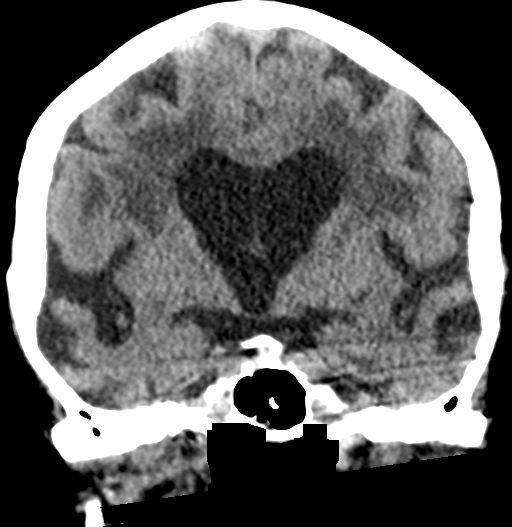

[Series 7: sagittal soft tissue · sagittal · 0.28mm/px · 3 of 47 slices shown]
[im 16/47  brain]
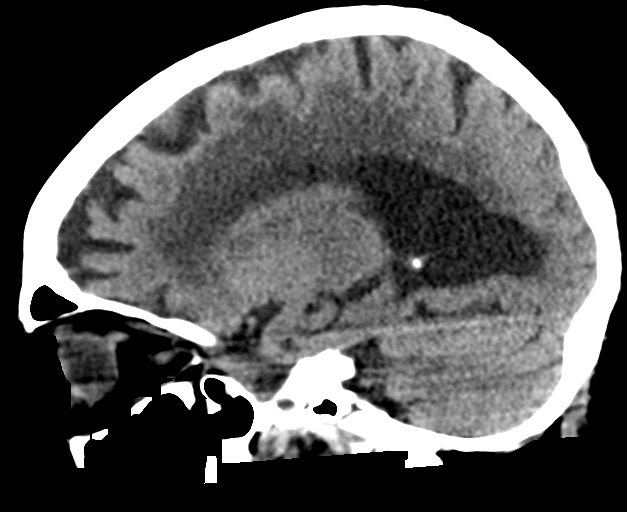
[im 24/47  brain]
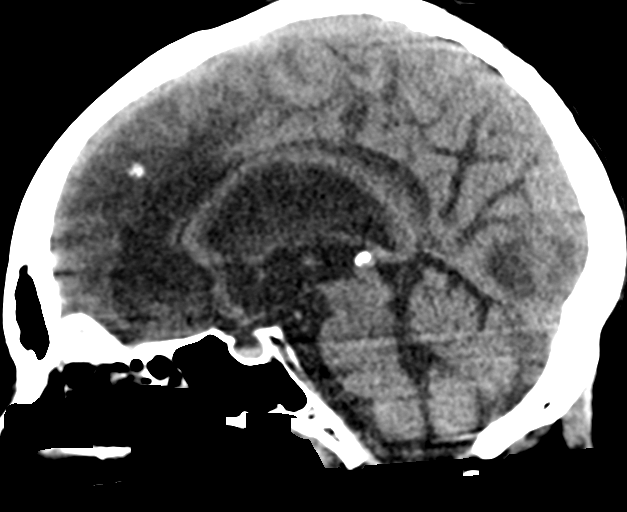
[im 31/47  brain]
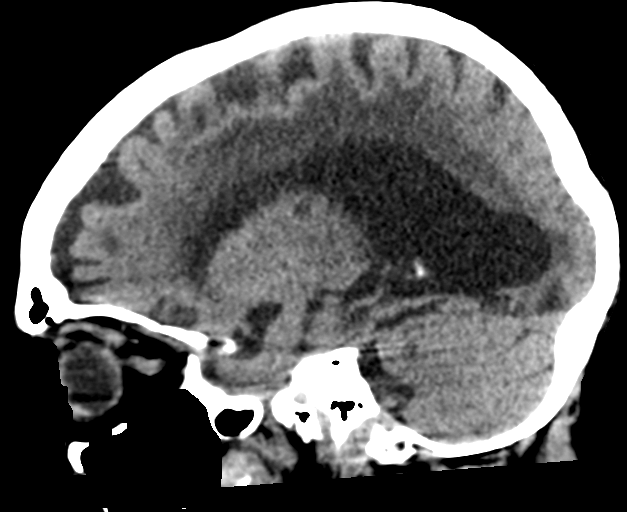

[15 of 47 positions shown; findings below may reference images not displayed]

FINDINGS: Brain: Moderate diffuse atrophy is again noted. There is no
intracranial mass, hemorrhage, extra-axial fluid collection, or
midline shift. There is evidence of a prior infarct in the medial
right occipital lobe. There is mild ex vacuo phenomenon involving
the atrium of the right lateral ventricle in this area. There is
extensive small vessel disease throughout the centra semiovale
bilaterally. There is evidence of an infarct in the upper mid right
occipital lobe which appears recent and may well be acute. No other
acute appearing infarct evident.

Vascular: There is no hyperdense vessel evident. There is no
appreciable vascular calcification.

Skull: The bony calvarium appears intact.

Sinuses/Orbits: There is mild mucosal thickening in several ethmoid
air cells bilaterally. Other paranasal sinuses are clear. There is
rightward deviation of the mid nasal septum. Orbits appear symmetric
bilaterally.

Other: Visualized mastoid air cells are clear.
IMPRESSION: Suspect recent/acute infarct in the upper mid right occipital lobe.
There is a prior infarct in the medial right occipital lobe with
nearby right lateral ventricular encephalomalacia. There is
elsewhere generalized atrophy with extensive periventricular small
vessel disease.

There is no mass, hemorrhage, or extra-axial fluid collection. There
is mild mucosal thickening in several ethmoid air cells bilaterally.
